# Patient Record
Sex: Female | Born: 1935
Health system: Southern US, Community
[De-identification: ages and names within clinical notes are randomized; demographics above are authoritative.]

## PROBLEM LIST (undated history)

## (undated) DIAGNOSIS — I10 Essential (primary) hypertension: Secondary | ICD-10-CM

## (undated) DIAGNOSIS — C20 Malignant neoplasm of rectum: Secondary | ICD-10-CM

## (undated) HISTORY — PX: CATARACT EXTRACTION, BILATERAL: SHX1313

## (undated) HISTORY — PX: COLONOSCOPY: SHX174

---

## 2001-06-23 ENCOUNTER — Encounter: Payer: Self-pay | Admitting: Family Medicine

## 2001-06-23 ENCOUNTER — Ambulatory Visit (HOSPITAL_COMMUNITY): Admission: RE | Admit: 2001-06-23 | Discharge: 2001-06-23 | Payer: Self-pay

## 2001-10-07 ENCOUNTER — Other Ambulatory Visit: Admission: RE | Admit: 2001-10-07 | Discharge: 2001-10-07 | Payer: Self-pay | Admitting: Family Medicine

## 2001-10-26 ENCOUNTER — Ambulatory Visit (HOSPITAL_COMMUNITY): Admission: RE | Admit: 2001-10-26 | Discharge: 2001-10-26 | Payer: Self-pay | Admitting: General Surgery

## 2002-06-26 ENCOUNTER — Encounter: Payer: Self-pay | Admitting: Family Medicine

## 2002-06-26 ENCOUNTER — Ambulatory Visit (HOSPITAL_COMMUNITY): Admission: RE | Admit: 2002-06-26 | Discharge: 2002-06-26 | Payer: Self-pay | Admitting: Family Medicine

## 2003-09-13 ENCOUNTER — Ambulatory Visit (HOSPITAL_COMMUNITY): Admission: RE | Admit: 2003-09-13 | Discharge: 2003-09-13 | Payer: Self-pay | Admitting: Family Medicine

## 2003-12-16 ENCOUNTER — Emergency Department (HOSPITAL_COMMUNITY): Admission: EM | Admit: 2003-12-16 | Discharge: 2003-12-16 | Payer: Self-pay | Admitting: Emergency Medicine

## 2003-12-22 ENCOUNTER — Emergency Department (HOSPITAL_COMMUNITY): Admission: EM | Admit: 2003-12-22 | Discharge: 2003-12-22 | Payer: Self-pay | Admitting: Emergency Medicine

## 2004-05-19 ENCOUNTER — Ambulatory Visit (HOSPITAL_COMMUNITY): Admission: RE | Admit: 2004-05-19 | Discharge: 2004-05-19 | Payer: Self-pay | Admitting: Family Medicine

## 2005-02-04 ENCOUNTER — Ambulatory Visit (HOSPITAL_COMMUNITY): Admission: RE | Admit: 2005-02-04 | Discharge: 2005-02-04 | Payer: Self-pay | Admitting: Family Medicine

## 2006-03-12 ENCOUNTER — Ambulatory Visit (HOSPITAL_COMMUNITY): Admission: RE | Admit: 2006-03-12 | Discharge: 2006-03-12 | Payer: Self-pay | Admitting: Family Medicine

## 2007-04-14 ENCOUNTER — Ambulatory Visit (HOSPITAL_COMMUNITY): Admission: RE | Admit: 2007-04-14 | Discharge: 2007-04-14 | Payer: Self-pay | Admitting: Family Medicine

## 2008-05-03 ENCOUNTER — Ambulatory Visit (HOSPITAL_COMMUNITY): Admission: RE | Admit: 2008-05-03 | Discharge: 2008-05-03 | Payer: Self-pay | Admitting: Family Medicine

## 2009-05-15 ENCOUNTER — Ambulatory Visit (HOSPITAL_COMMUNITY): Admission: RE | Admit: 2009-05-15 | Discharge: 2009-05-15 | Payer: Self-pay | Admitting: Family Medicine

## 2010-06-02 ENCOUNTER — Ambulatory Visit (HOSPITAL_COMMUNITY): Admission: RE | Admit: 2010-06-02 | Discharge: 2010-06-02 | Payer: Self-pay | Admitting: Family Medicine

## 2010-12-19 NOTE — H&P (Signed)
[facility]  Patient:    VALENA, IVANOV Visit Number: [id number] MRN: [id number]          Service Type: DSU Location: DAY Attending Physician:  Dessa Phi Dictated by:   Elpidio Anis, M.D. Admit Date:  10/26/2001 Discharge Date: 10/26/2001   CC:         John Giovanni, M.D.   History and Physical  CHIEF COMPLAINT:  This is a 77 year old female, with a history of progressive constipation, who is referred for screening colonoscopy.  HISTORY OF PRESENT ILLNESS:  There is no history of polyps or colon cancer. There is no family history of polyps or colon cancer.  The patient will have screening colonoscopy.  PAST MEDICAL HISTORY:  1. Hypertension.  2. Hypercholesterolemia.  MEDICATIONS:  1. Maxzide 50 mg q.d.  2. Lisinopril 20 mg q.d.  3. Zocor 40 mg q.d.  4. K-Dur 20 mEq q.d.  5. Evista 60 mg q.d.  6. Ecotrin 81 mg q.d.  7. Vitamin B with calcium two q.d.  PAST SURGICAL HISTORY:  None.  SOCIAL HISTORY:  No history of tobacco or alcohol or drug use.  REVIEW OF SYSTEMS:  Negative.  There is no history of heart or lung disease.  PHYSICAL EXAMINATION:  VITAL SIGNS:  Blood pressure 150/100, pulse 72, respirations 16.  Weight 162 pounds.  HEENT:  Unremarkable.  NECK:  Supple without JVD or bruits.  CHEST:  Clear to auscultation.  HEART:  Regular rate and rhythm without murmur, gallop, or rub.  ABDOMEN:  Soft, nontender.  No masses.  EXTREMITIES:  No clubbing, cyanosis, or edema.  NEUROLOGIC:  Nonfocal.  IMPRESSION:  1. Chronic constipation with recurrent rectal bleeding.  2. Hypertension.  PLAN:  Colonoscopy. Dictated by:   Elpidio Anis, M.D. Attending Physician:  Dessa Phi DD:  10/25/01 TD:  10/26/01 Job: [id number]

## 2011-05-19 ENCOUNTER — Other Ambulatory Visit (HOSPITAL_COMMUNITY): Payer: Self-pay | Admitting: Family Medicine

## 2011-05-19 DIAGNOSIS — Z139 Encounter for screening, unspecified: Secondary | ICD-10-CM

## 2011-06-05 ENCOUNTER — Ambulatory Visit (HOSPITAL_COMMUNITY)
Admission: RE | Admit: 2011-06-05 | Discharge: 2011-06-05 | Disposition: A | Discharge status: Home / Self Care | Payer: Medicare Other | Source: Ambulatory Visit | Attending: Family Medicine | Admitting: Family Medicine

## 2011-06-05 DIAGNOSIS — Z139 Encounter for screening, unspecified: Secondary | ICD-10-CM

## 2011-06-05 DIAGNOSIS — Z1231 Encounter for screening mammogram for malignant neoplasm of breast: Secondary | ICD-10-CM | POA: Insufficient documentation

## 2011-08-01 ENCOUNTER — Emergency Department (HOSPITAL_COMMUNITY)
Admission: EM | Admit: 2011-08-01 | Discharge: 2011-08-01 | Disposition: A | Discharge status: Home / Self Care | Payer: Medicare Other | Attending: Emergency Medicine | Admitting: Emergency Medicine

## 2011-08-01 ENCOUNTER — Other Ambulatory Visit: Payer: Self-pay

## 2011-08-01 ENCOUNTER — Emergency Department (HOSPITAL_COMMUNITY): Payer: Medicare Other

## 2011-08-01 DIAGNOSIS — Z7982 Long term (current) use of aspirin: Secondary | ICD-10-CM | POA: Insufficient documentation

## 2011-08-01 DIAGNOSIS — J9819 Other pulmonary collapse: Secondary | ICD-10-CM | POA: Insufficient documentation

## 2011-08-01 DIAGNOSIS — I10 Essential (primary) hypertension: Secondary | ICD-10-CM | POA: Insufficient documentation

## 2011-08-01 DIAGNOSIS — R42 Dizziness and giddiness: Secondary | ICD-10-CM | POA: Insufficient documentation

## 2011-08-01 HISTORY — DX: Essential (primary) hypertension: I10

## 2011-08-01 LAB — CBC
HCT: 45.4 % (ref 36.0–46.0)
MCH: 30.4 pg (ref 26.0–34.0)
MCHC: 32.8 g/dL (ref 30.0–36.0)
MCV: 92.7 fL (ref 78.0–100.0)
Platelets: 228 10*3/uL (ref 150–400)
RBC: 4.9 MIL/uL (ref 3.87–5.11)

## 2011-08-01 LAB — BASIC METABOLIC PANEL
BUN: 14 mg/dL (ref 6–23)
CO2: 27 mEq/L (ref 19–32)
GFR calc Af Amer: 69 mL/min — ABNORMAL LOW (ref >90)
Potassium: 3.5 mEq/L (ref 3.5–5.1)
Sodium: 138 mEq/L (ref 135–145)

## 2011-08-01 LAB — DIFFERENTIAL
Basophils Absolute: 0 10*3/uL (ref 0.0–0.1)
Basophils Relative: 0 % (ref 0–1)
Eosinophils Absolute: 0.1 10*3/uL (ref 0.0–0.7)
Eosinophils Relative: 1 % (ref 0–5)
Lymphocytes Relative: 30 % (ref 12–46)
Lymphs Abs: 1.9 10*3/uL (ref 0.7–4.0)
Neutro Abs: 3.7 10*3/uL (ref 1.7–7.7)
Neutrophils Relative %: 60 % (ref 43–77)

## 2011-08-01 LAB — URINALYSIS, ROUTINE W REFLEX MICROSCOPIC: pH: 7 (ref 5.0–8.0)

## 2011-08-01 NOTE — ED Provider Notes (Signed)
This chart was scribed for [employer]. Rubin Payor, MD by Wallis Mart. The patient was seen in room APA08/APA08 and the patient's care was started at 5:43 PM.   CSN: [id number]  Arrival date & time 08/01/11  1609   First MD Initiated Contact with Patient 08/01/11 1716      Chief Complaint  Patient presents with  . Dizziness    (Consider location/radiation/quality/duration/timing/severity/associated sxs/prior treatment) HPI Pt seen at 5:43 PM  Cheyenne Morris is a 77 y.o. female who presents to the Emergency Department complaining of sudden onset, moderate, intermittent dizziness that started when taking down Christmas decorations 3 hours ago. The dizziness worsens when moving around and describes the dizziness as a spinning sensation.   Per pt, it has been "a long time" since feeling dizzy and she hasn't seen anyone in the past for dizziness. Pt c/o associated nausea, facial pain, but is not currently feeling nauseated.Pt c/o weakness in legs that has improved. Pt denies headache, ringing in ears, and any other sx.    Pt w/ h/o of hypertension and dm.  Pt reports no recent changes in medications.  Pt c/o flu-like sx 2 weeks ago.     PCP: Dr Sudie Bailey  Past Medical History  Diagnosis Date  . Diabetes mellitus   . Hypertension     History reviewed. No pertinent past surgical history.  No family history on file.  History  Substance Use Topics  . Smoking status: Never Smoker   . Smokeless tobacco: Not on file  . Alcohol Use: No    OB History    Grav Para Term Preterm Abortions TAB SAB Ect Mult Living                  Review of Systems  10 Systems reviewed and are negative for acute change except as noted in the HPI.  Allergies  Review of patient's allergies indicates no known allergies.  Home Medications   Current Outpatient Rx  Name Route Sig Dispense Refill  . ASPIRIN 81 MG PO CHEW Oral Chew 81 mg by mouth daily.      Marland Kitchen CALCIUM-VITAMIN D 600-200 MG-UNIT PO  CAPS Oral Take 1 tablet by mouth daily.      Marland Kitchen GLIMEPIRIDE 2 MG PO TABS Oral Take 2 mg by mouth daily before breakfast.      . LATANOPROST 0.005 % OP SOLN Both Eyes Place 1 drop into both eyes at bedtime.      Marland Kitchen LISINOPRIL 20 MG PO TABS Oral Take 20 mg by mouth daily.      Marland Kitchen POTASSIUM CHLORIDE CRYS CR 20 MEQ PO TBCR Oral Take 20 mEq by mouth daily.      Marland Kitchen SIMVASTATIN 40 MG PO TABS Oral Take 40 mg by mouth at bedtime.      . TRIAMTERENE-HCTZ 75-50 MG PO TABS Oral Take 1 tablet by mouth daily.        BP 181/72  Pulse 79  Temp(Src) 98.5 F (36.9 C) (Oral)  Resp 18  Ht 5\' 2"  (1.575 m)  Wt 160 lb (72.576 kg)  BMI 29.26 kg/m2  SpO2 95%  Physical Exam  Nursing note and vitals reviewed. Constitutional: She is oriented to person, place, and time. She appears well-developed and well-nourished. No distress.  HENT:  Head: Normocephalic and atraumatic.  Mouth/Throat: Oropharynx is clear and moist.  Eyes: EOM are normal. Pupils are equal, round, and reactive to light.       No nystagmus  Neck: Normal range  of motion. Neck supple. No tracheal deviation present.  Cardiovascular: Normal rate, regular rhythm and normal heart sounds.   Pulmonary/Chest: Effort normal and breath sounds normal. No respiratory distress.  Abdominal: Soft. She exhibits no distension.  Musculoskeletal: Normal range of motion. She exhibits no edema.  Neurological: She is alert and oriented to person, place, and time. No sensory deficit.       Finger/nose test normal on left side Difficulty following commands for finger/nose test on right side   Skin: Skin is warm and dry.  Psychiatric: She has a normal mood and affect. Her behavior is normal.    ED Course  Procedures (including critical care time) DIAGNOSTIC STUDIES: Oxygen Saturation is 97% on room air, normal by my interpretation.    COORDINATION OF CARE:    Labs Reviewed  BASIC METABOLIC PANEL - Abnormal; Notable for the following:    Glucose, Bld 114 (*)      Calcium 11.5 (*)    GFR calc non Af Amer 60 (*)    GFR calc Af Amer 69 (*)    All other components within normal limits  URINALYSIS, ROUTINE W REFLEX MICROSCOPIC - Abnormal; Notable for the following:    Hgb urine dipstick TRACE (*)    Ketones, ur TRACE (*)    All other components within normal limits  CBC  DIFFERENTIAL  TROPONIN I  URINE MICROSCOPIC-ADD ON   Dg Chest 2 View  08/01/2011  *RADIOLOGY REPORT*  Clinical Data: Dizziness  CHEST - 2 VIEW  Comparison: None.  Findings: Cardiomediastinal silhouette is unremarkable.  No acute infiltrate or pleural effusion.  No pulmonary edema.  Bony thorax is unremarkable.  Mild basilar atelectasis or scarring.  IMPRESSION: No acute infiltrate or pulmonary edema.  Mild basilar atelectasis or scarring.  Original Report Authenticated By: Natasha Mead, M.D.   Ct Head Wo Contrast  08/01/2011  *RADIOLOGY REPORT*  Clinical Data: Dizziness and nausea.  Hypertension.  CT HEAD WITHOUT CONTRAST  Technique:  Contiguous axial images were obtained from the base of the skull through the vertex without contrast.  Comparison: None.  Findings: Scattered subcortical white matter hypoattenuation is evident bilaterally.  No acute cortical infarct, hemorrhage, or mass lesion is evident.  The ventricles are of normal size.  No significant extra-axial fluid collection is present.  The paranasal sinuses and mastoid air cells are clear. Atherosclerotic calcifications are noted within the cavernous carotid arteries bilaterally.  IMPRESSION:  1.  No acute intracranial abnormality. 2.  Mild white matter disease.  Original Report Authenticated By: Jamesetta Orleans. MATTERN, M.D.     1. Hypertension   2. Hypercalcemia   3. Dizziness     Date: 08/01/2011  Rate: 83  Rhythm: normal sinus rhythm  QRS Axis: normal  Intervals: normal  ST/T Wave abnormalities: normal  Conduction Disutrbances:none  Narrative Interpretation:   Old EKG Reviewed: none available     MDM  Patient  presents with dizziness for couple hours. She's recently on antibiotics for sinus infection. She was found to have an elevated blood pressure. She states she's not been taking her water pill because it makes her urinate a lot. She was also found to have a mild hypercalcemia. She is on calcium supplementation. Her blood pressures come down somewhat on its own. EKG does show ischemic changes. Her head CT is negative for bleed. I doubt this is a central vertigo. She will followup with Dr. Sudie Bailey, her primary care Dr. She will hold off on her calcium supplementation for 3 days.  I personally performed the services described in this documentation, which was scribed in my presence. The recorded information has been reviewed and considered.      Juliet Rude. Rubin Payor, MD 08/01/11 2034

## 2011-08-01 NOTE — ED Notes (Signed)
Pt presents with dizziness x 2 hours. Pt denies all other symptoms. Pt states she was recently seen by PMD for sinus infection and has completed ABX.

## 2011-08-01 NOTE — ED Notes (Signed)
Pt assisted to restroom, pt c/o minimal dizziness.

## 2011-08-04 ENCOUNTER — Emergency Department (HOSPITAL_COMMUNITY)
Admission: EM | Admit: 2011-08-04 | Discharge: 2011-08-04 | Disposition: A | Discharge status: Home / Self Care | Payer: Medicare Other | Attending: Emergency Medicine | Admitting: Emergency Medicine

## 2011-08-04 ENCOUNTER — Encounter (HOSPITAL_COMMUNITY): Payer: Self-pay | Admitting: Emergency Medicine

## 2011-08-04 DIAGNOSIS — Z79899 Other long term (current) drug therapy: Secondary | ICD-10-CM | POA: Insufficient documentation

## 2011-08-04 DIAGNOSIS — I10 Essential (primary) hypertension: Secondary | ICD-10-CM | POA: Insufficient documentation

## 2011-08-04 DIAGNOSIS — J329 Chronic sinusitis, unspecified: Secondary | ICD-10-CM

## 2011-08-04 DIAGNOSIS — R51 Headache: Secondary | ICD-10-CM | POA: Insufficient documentation

## 2011-08-04 DIAGNOSIS — Z7982 Long term (current) use of aspirin: Secondary | ICD-10-CM | POA: Insufficient documentation

## 2011-08-04 DIAGNOSIS — E119 Type 2 diabetes mellitus without complications: Secondary | ICD-10-CM | POA: Insufficient documentation

## 2011-08-04 DIAGNOSIS — J3489 Other specified disorders of nose and nasal sinuses: Secondary | ICD-10-CM | POA: Insufficient documentation

## 2011-08-04 MED ORDER — KETOROLAC TROMETHAMINE 60 MG/2ML IM SOLN
60.0000 mg | Freq: Once | INTRAMUSCULAR | Status: AC
  Administered 2011-08-04: 60 mg via INTRAMUSCULAR
  Filled 2011-08-04: qty 2

## 2011-08-04 MED ORDER — DOXYCYCLINE HYCLATE 100 MG PO CAPS: 100.0000 mg | ORAL_CAPSULE | Freq: Two times a day (BID) | ORAL | Status: AC

## 2011-08-04 NOTE — ED Notes (Signed)
Patient reports stuffy nose and congestion in head, nose. States the congestion and stuffy nose started bothering her today. Was seen here Saturday for elevated blood pressure. Denies fever, chills.

## 2011-08-04 NOTE — ED Provider Notes (Signed)
History     CSN: [id number]  Arrival date & time 08/04/11  0129   First MD Initiated Contact with Patient 08/04/11 0240      Chief Complaint  Patient presents with  . Nasal Congestion    (Consider location/radiation/quality/duration/timing/severity/associated sxs/prior treatment) HPI Comments: 78 year old female with history of 24 hours of sinus pressure, pain with associated bitemporal headache. She has had nasal drainage which has been purulent but denies fevers nausea vomiting cough or shortness of breath. Symptoms are persistent, mild, 3/10 in intensity and not associated with stiff neck, fever, weakness numbness ataxia blurred vision or difficulty with speech. This was gradual in onset  The history is provided by the patient, the spouse and medical records.    Past Medical History  Diagnosis Date  . Diabetes mellitus   . Hypertension     History reviewed. No pertinent past surgical history.  Family History  Problem Relation Age of Onset  . Diabetes Son     History  Substance Use Topics  . Smoking status: Never Smoker   . Smokeless tobacco: Not on file  . Alcohol Use: No    OB History    Grav Para Term Preterm Abortions TAB SAB Ect Mult Living                  Review of Systems  All other systems reviewed and are negative.    Allergies  Review of patient's allergies indicates no known allergies.  Home Medications   Current Outpatient Rx  Name Route Sig Dispense Refill  . ASPIRIN 81 MG PO CHEW Oral Chew 81 mg by mouth daily.      Marland Kitchen CALCIUM-VITAMIN D 600-200 MG-UNIT PO CAPS Oral Take 1 tablet by mouth daily.      Marland Kitchen DOXYCYCLINE HYCLATE 100 MG PO CAPS Oral Take 1 capsule (100 mg total) by mouth 2 (two) times daily. 20 capsule 0  . GLIMEPIRIDE 2 MG PO TABS Oral Take 2 mg by mouth daily before breakfast.      . LATANOPROST 0.005 % OP SOLN Both Eyes Place 1 drop into both eyes at bedtime.      Marland Kitchen LISINOPRIL 20 MG PO TABS Oral Take 20 mg by mouth daily.        Marland Kitchen POTASSIUM CHLORIDE CRYS ER 20 MEQ PO TBCR Oral Take 20 mEq by mouth daily.      Marland Kitchen SIMVASTATIN 40 MG PO TABS Oral Take 40 mg by mouth at bedtime.      . TRIAMTERENE-HCTZ 75-50 MG PO TABS Oral Take 1 tablet by mouth daily.        BP 149/81  Pulse 91  Temp(Src) 98.8 F (37.1 C) (Oral)  Resp 18  Ht 5\' 2"  (1.575 m)  Wt 160 lb (72.576 kg)  BMI 29.26 kg/m2  SpO2 97%  Physical Exam  Nursing note and vitals reviewed. Constitutional: She appears well-developed and well-nourished. No distress.  HENT:  Head: Normocephalic and atraumatic.  Mouth/Throat: Oropharynx is clear and moist. No oropharyngeal exudate.       Nasal congestion bilaterally, turbinate swelling bilaterally. Nasal discharge present, oropharynx clear, tympanic membranes clear bilaterally  Eyes: Conjunctivae and EOM are normal. Pupils are equal, round, and reactive to light. Right eye exhibits no discharge. Left eye exhibits no discharge. No scleral icterus.  Neck: Normal range of motion. Neck supple. No JVD present. No thyromegaly present.  Cardiovascular: Normal rate, regular rhythm, normal heart sounds and intact distal pulses.  Exam reveals no gallop and no  friction rub.   No murmur heard. Pulmonary/Chest: Effort normal and breath sounds normal. No respiratory distress. She has no wheezes. She has no rales.  Abdominal: Soft. Bowel sounds are normal. She exhibits no distension and no mass. There is no tenderness.  Musculoskeletal: Normal range of motion. She exhibits no edema and no tenderness.  Lymphadenopathy:    She has no cervical adenopathy.  Neurological: She is alert. Coordination normal.  Skin: Skin is warm and dry. No rash noted. No erythema.  Psychiatric: She has a normal mood and affect. Her behavior is normal.    ED Course  Procedures (including critical care time)  Labs Reviewed - No data to display No results found.   1. Sinus headache   2. Sinusitis       MDM  Well-appearing female with  reassuring vital signs, amenable to treatment with Zyrtec, doxycycline, intramuscular Toradol prior to discharge.        Vida Roller, MD 08/04/11 [phone]

## 2011-08-07 ENCOUNTER — Encounter (HOSPITAL_COMMUNITY): Payer: Self-pay | Admitting: *Deleted

## 2011-08-07 ENCOUNTER — Emergency Department (HOSPITAL_COMMUNITY)
Admission: EM | Admit: 2011-08-07 | Discharge: 2011-08-07 | Disposition: A | Discharge status: Home / Self Care | Payer: Medicare Other | Attending: Emergency Medicine | Admitting: Emergency Medicine

## 2011-08-07 DIAGNOSIS — Z79899 Other long term (current) drug therapy: Secondary | ICD-10-CM | POA: Insufficient documentation

## 2011-08-07 DIAGNOSIS — E119 Type 2 diabetes mellitus without complications: Secondary | ICD-10-CM | POA: Insufficient documentation

## 2011-08-07 DIAGNOSIS — Z7982 Long term (current) use of aspirin: Secondary | ICD-10-CM | POA: Insufficient documentation

## 2011-08-07 DIAGNOSIS — R42 Dizziness and giddiness: Secondary | ICD-10-CM | POA: Insufficient documentation

## 2011-08-07 DIAGNOSIS — I10 Essential (primary) hypertension: Secondary | ICD-10-CM | POA: Insufficient documentation

## 2011-08-07 DIAGNOSIS — R51 Headache: Secondary | ICD-10-CM | POA: Insufficient documentation

## 2011-08-07 LAB — DIFFERENTIAL
Basophils Relative: 0 % (ref 0–1)
Eosinophils Relative: 2 % (ref 0–5)
Lymphocytes Relative: 46 % (ref 12–46)
Lymphs Abs: 2.4 10*3/uL (ref 0.7–4.0)
Monocytes Absolute: 0.5 10*3/uL (ref 0.1–1.0)
Monocytes Relative: 9 % (ref 3–12)
Neutrophils Relative %: 43 % (ref 43–77)

## 2011-08-07 LAB — BASIC METABOLIC PANEL
Calcium: 11.5 mg/dL — ABNORMAL HIGH (ref 8.4–10.5)
Creatinine, Ser: 1.34 mg/dL — ABNORMAL HIGH (ref 0.50–1.10)
GFR calc Af Amer: 43 mL/min — ABNORMAL LOW (ref >90)
GFR calc non Af Amer: 37 mL/min — ABNORMAL LOW (ref >90)
Sodium: 137 mEq/L (ref 135–145)

## 2011-08-07 LAB — CBC
HCT: 42.5 % (ref 36.0–46.0)
MCH: 29.6 pg (ref 26.0–34.0)
MCHC: 31.8 g/dL (ref 30.0–36.0)
MCV: 93.2 fL (ref 78.0–100.0)
Platelets: 194 10*3/uL (ref 150–400)
WBC: 5.1 10*3/uL (ref 4.0–10.5)

## 2011-08-07 MED ORDER — AMLODIPINE BESYLATE 5 MG PO TABS
5.0000 mg | ORAL_TABLET | Freq: Once | ORAL | Status: AC
  Administered 2011-08-07: 5 mg via ORAL
  Filled 2011-08-07: qty 1

## 2011-08-07 MED ORDER — AMLODIPINE BESYLATE 10 MG PO TABS: 5.0000 mg | ORAL_TABLET | Freq: Every day | ORAL | Status: DC

## 2011-08-07 NOTE — ED Notes (Signed)
Pt states, "I think my blood pressure is high" Symptoms include headache ("nagging pain") and dizziness. NAD.

## 2011-08-07 NOTE — ED Notes (Signed)
Pt states this is the third time she has been here for elevated bp states she recently started a otc cold med

## 2011-08-07 NOTE — ED Provider Notes (Signed)
History     CSN: [id number]  Arrival date & time 08/07/11  1811   First MD Initiated Contact with Patient 08/07/11 1826      Chief Complaint  Patient presents with  . Hypertension    (Consider location/radiation/quality/duration/timing/severity/associated sxs/prior treatment) HPI Patient presents to the emergency room with complaints of high blood pressure. Patient states she's had a very mild headache in the frontal region of her head and she felt a little bit lightheaded. Patient states she's had this from before when her blood pressure was higher. She has not had any trouble with her gait. She has not had numbness or weakness. She's not had any trouble with her speech and there's been no confusion. Patient denies any problems with chest pain or shortness of breath. Patient does have history of hypertension and she takes blood pressure medications including lisinopril and Maxzide. She has not skipped any of those and last took her medications this morning.  Patient states the symptoms are mild to moderate. Past Medical History  Diagnosis Date  . Diabetes mellitus   . Hypertension     History reviewed. No pertinent past surgical history.  Family History  Problem Relation Age of Onset  . Diabetes Son     History  Substance Use Topics  . Smoking status: Never Smoker   . Smokeless tobacco: Not on file  . Alcohol Use: No    OB History    Grav Para Term Preterm Abortions TAB SAB Ect Mult Living                  Review of Systems  All other systems reviewed and are negative.    Allergies  Review of patient's allergies indicates no known allergies.  Home Medications   Current Outpatient Rx  Name Route Sig Dispense Refill  . ASPIRIN 81 MG PO CHEW Oral Chew 81 mg by mouth daily.      Marland Kitchen CALCIUM-VITAMIN D 600-200 MG-UNIT PO CAPS Oral Take 1 tablet by mouth daily.      Marland Kitchen DOXYCYCLINE HYCLATE 100 MG PO CAPS Oral Take 1 capsule (100 mg total) by mouth 2 (two) times daily. 20  capsule 0  . GLIMEPIRIDE 2 MG PO TABS Oral Take 2 mg by mouth daily before breakfast.      . LATANOPROST 0.005 % OP SOLN Both Eyes Place 1 drop into both eyes at bedtime.      Marland Kitchen LISINOPRIL 20 MG PO TABS Oral Take 20 mg by mouth daily.      Marland Kitchen POTASSIUM CHLORIDE CRYS ER 20 MEQ PO TBCR Oral Take 20 mEq by mouth daily.      Marland Kitchen SIMVASTATIN 40 MG PO TABS Oral Take 40 mg by mouth at bedtime.      . TRIAMTERENE-HCTZ 75-50 MG PO TABS Oral Take 1 tablet by mouth daily.        BP 206/82  Pulse 100  Temp(Src) 98.3 F (36.8 C) (Oral)  Resp 18  SpO2 100%  Physical Exam  Nursing note and vitals reviewed. Constitutional: She is oriented to person, place, and time. She appears well-developed and well-nourished. No distress.  HENT:  Head: Normocephalic and atraumatic.  Right Ear: External ear normal.  Left Ear: External ear normal.  Mouth/Throat: Oropharynx is clear and moist.  Eyes: Conjunctivae are normal. Right eye exhibits no discharge. Left eye exhibits no discharge. No scleral icterus.  Neck: Neck supple. No tracheal deviation present.  Cardiovascular: Normal rate, regular rhythm and intact distal pulses.  Pulmonary/Chest: Effort normal and breath sounds normal. No stridor. No respiratory distress. She has no wheezes. She has no rales.  Abdominal: Soft. Bowel sounds are normal. She exhibits no distension. There is no tenderness. There is no rebound and no guarding.  Musculoskeletal: She exhibits no edema and no tenderness.  Neurological: She is alert and oriented to person, place, and time. She has normal strength. No cranial nerve deficit ( no gross defecits noted) or sensory deficit. She exhibits normal muscle tone. She displays no seizure activity. Coordination normal.       5 out of 5 strength bilateral upper [place] some lower extremities, sensation intact in all extremities, no visual field cuts, no left or right sided neglect, patient was able to ambulate to the room without difficulty    Skin: Skin is warm and dry. No rash noted.  Psychiatric: She has a normal mood and affect.    ED Course  Procedures (including critical care time)  Labs Reviewed  BASIC METABOLIC PANEL - Abnormal; Notable for the following:    Glucose, Bld 115 (*)    BUN 34 (*)    Creatinine, Ser 1.34 (*)    Calcium 11.5 (*)    GFR calc non Af Amer 37 (*)    GFR calc Af Amer 43 (*)    All other components within normal limits  CBC  DIFFERENTIAL   No results found.  Diagnosis: Hypertension   MDM  The patient's blood pressure is improved with the Norvasc. Patient has a normal neurologic exam and has been able to ambulate. I doubt stroke or TIA. Her BUN and creatinine are slightly increased from previous values. For that reason I will not increase her Zestril. Patient has responded to the Norvasc. Will start her on a low dose of that and have her followup with her primary-care Dr. next week.        Celene Kras, MD 08/07/11 [phone]

## 2011-12-02 ENCOUNTER — Other Ambulatory Visit (HOSPITAL_COMMUNITY): Payer: Self-pay | Admitting: Family Medicine

## 2011-12-09 ENCOUNTER — Ambulatory Visit (HOSPITAL_COMMUNITY)
Admission: RE | Admit: 2011-12-09 | Discharge: 2011-12-09 | Disposition: A | Discharge status: Home / Self Care | Payer: Medicare Other | Source: Ambulatory Visit | Attending: Family Medicine | Admitting: Family Medicine

## 2011-12-09 DIAGNOSIS — M810 Age-related osteoporosis without current pathological fracture: Secondary | ICD-10-CM | POA: Insufficient documentation

## 2011-12-29 NOTE — H&P (Signed)
  NTS SOAP Note  Vital Signs:  Vitals as of: 12/29/2011: Systolic 177: Diastolic 89: Heart Rate 91: Temp 98.30F: Height 11ft 2in: Weight 157Lbs 0 Ounces: BMI 29  BMI : 28.72 kg/m2  Subjective: This 34 Years 94 Months old Female presents for screening TCS.  Had a TCS many years ago.  Denies any GI complaints.  No family h/o colon carcinoma.   Review of Symptoms:  Constitutional:unremarkable   Head:unremarkable    Eyes:unremarkable   Nose/Mouth/Throat:unremarkable Cardiovascular:  unremarkable   Respiratory:unremarkable   Gastrointestinal:  unremarkable   Genitourinary:unremarkable     Musculoskeletal:unremarkable   Skin:unremarkable Hematolgic/Lymphatic:unremarkable     Allergic/Immunologic:unremarkable     Past Medical History:    Reviewed   Past Medical History  Surgical History: unknown Medical Problems:  Diabetes, High Blood pressure, High cholesterol Allergies: nkda Medications: lisinopril, baby ASA, maxide, kdur, simvastatin, calcium, glipuride    Social History:Reviewed  Social History  Preferred Language: English ([place]) Race:  Black or African American Ethnicity: Not Hispanic / Latino Age: 78 Years 5 Months Marital Status:  M Alcohol:  No Recreational drug(s):  No   Smoking Status: Never smoker reviewed on 12/29/2011  Family History:  Reviewed   Family History  Is there a family history of:No family h/o colon carcinoma    Objective Information: General:  Well appearing, well nourished in no distress. Head:Atraumatic; no masses; no abnormalities Heart:  RRR, no murmur Lungs:    CTA bilaterally, no wheezes, rhonchi, rales.  Breathing unlabored. Abdomen:Soft, NT/ND, no HSM, no masses.   deferred to procedure  Assessment:Need for screening TCS  Diagnosis &amp; Procedure: DiagnosisCode: V76.51, ProcedureCode: 16109,    Plan:Scheduled for TCS on 01/05/12.   Patient  Education:Alternative treatments to surgery were discussed with patient (and family).  Risks and benefits  of procedure were fully explained to the patient (and family) who gave informed consent. Patient/family questions were addressed.  Follow-up:Pending Surgery

## 2011-12-31 ENCOUNTER — Encounter (HOSPITAL_COMMUNITY): Payer: Self-pay | Admitting: Pharmacy Technician

## 2012-01-04 MED ORDER — SODIUM CHLORIDE 0.45 % IV SOLN
Freq: Once | INTRAVENOUS | Status: AC
  Administered 2012-01-05: 09:00:00 via INTRAVENOUS

## 2012-01-05 ENCOUNTER — Encounter (HOSPITAL_COMMUNITY): Payer: Self-pay | Admitting: *Deleted

## 2012-01-05 ENCOUNTER — Encounter (HOSPITAL_COMMUNITY)
Admission: RE | Disposition: A | Discharge status: Home / Self Care | Payer: Self-pay | Source: Ambulatory Visit | Attending: General Surgery

## 2012-01-05 ENCOUNTER — Ambulatory Visit (HOSPITAL_COMMUNITY)
Admission: RE | Admit: 2012-01-05 | Discharge: 2012-01-05 | Disposition: A | Discharge status: Home / Self Care | Payer: Medicare Other | Source: Ambulatory Visit | Attending: General Surgery | Admitting: General Surgery

## 2012-01-05 DIAGNOSIS — Z1211 Encounter for screening for malignant neoplasm of colon: Secondary | ICD-10-CM | POA: Insufficient documentation

## 2012-01-05 DIAGNOSIS — Z01812 Encounter for preprocedural laboratory examination: Secondary | ICD-10-CM | POA: Insufficient documentation

## 2012-01-05 DIAGNOSIS — E119 Type 2 diabetes mellitus without complications: Secondary | ICD-10-CM | POA: Insufficient documentation

## 2012-01-05 DIAGNOSIS — D126 Benign neoplasm of colon, unspecified: Secondary | ICD-10-CM | POA: Insufficient documentation

## 2012-01-05 DIAGNOSIS — I10 Essential (primary) hypertension: Secondary | ICD-10-CM | POA: Insufficient documentation

## 2012-01-05 DIAGNOSIS — E7800 Pure hypercholesterolemia, unspecified: Secondary | ICD-10-CM | POA: Insufficient documentation

## 2012-01-05 DIAGNOSIS — Z79899 Other long term (current) drug therapy: Secondary | ICD-10-CM | POA: Insufficient documentation

## 2012-01-05 HISTORY — PX: COLONOSCOPY: SHX5424

## 2012-01-05 LAB — GLUCOSE, CAPILLARY: Glucose-Capillary: 116 mg/dL — ABNORMAL HIGH (ref 70–99)

## 2012-01-05 SURGERY — COLONOSCOPY
Anesthesia: Moderate Sedation

## 2012-01-05 MED ORDER — STERILE WATER FOR IRRIGATION IR SOLN
Status: DC | PRN
  Administered 2012-01-05: 10:00:00

## 2012-01-05 MED ORDER — MIDAZOLAM HCL 5 MG/5ML IJ SOLN
INTRAMUSCULAR | Status: DC | PRN
  Administered 2012-01-05: 2 mg via INTRAVENOUS
  Administered 2012-01-05: 1 mg via INTRAVENOUS

## 2012-01-05 MED ORDER — MEPERIDINE HCL 50 MG/ML IJ SOLN
INTRAMUSCULAR | Status: AC
  Filled 2012-01-05: qty 2

## 2012-01-05 MED ORDER — MIDAZOLAM HCL 5 MG/5ML IJ SOLN
INTRAMUSCULAR | Status: AC
  Filled 2012-01-05: qty 10

## 2012-01-05 MED ORDER — MEPERIDINE HCL 25 MG/ML IJ SOLN
INTRAMUSCULAR | Status: DC | PRN
  Administered 2012-01-05: 50 mg via INTRAVENOUS

## 2012-01-05 NOTE — Interval H&P Note (Signed)
History and Physical Interval Note:  01/05/2012 9:45 AM  Cheyenne Morris  has presented today for surgery, with the diagnosis of Special screening for malignant neoplasms, colon   The various methods of treatment have been discussed with the patient and family. After consideration of risks, benefits and other options for treatment, the patient has consented to  Procedure(s) (LRB): COLONOSCOPY (N/A) as a surgical intervention .  The patients' history has been reviewed, patient examined, no change in status, stable for surgery.  I have reviewed the patients' chart and labs.  Questions were answered to the patient's satisfaction.     Franky Macho A

## 2012-01-05 NOTE — Op Note (Signed)
[facility] [address] [place], [place]  [address]  COLONOSCOPY PROCEDURE REPORT  PATIENT:  Cheyenne, Morris  MR#:  [id number] BIRTHDATE:  1931/06/09, 80 yrs. old  GENDER:  female ENDOSCOPIST:  Franky Macho, MD REF. BY:  Gareth Morgan, M.D. PROCEDURE DATE:  01/05/2012 PROCEDURE:  Colonoscopy with snare polypectomy ASA CLASS:  Class II INDICATIONS:  Screening MEDICATIONS:   Versed 3 mg IV, demerol 50 mg IV  DESCRIPTION OF PROCEDURE:   After the risks benefits and alternatives of the procedure were thoroughly explained, informed consent was obtained.  Digital rectal exam was performed and revealed external hemorrhoids.   The [id number] ([id number]) endoscope was introduced through the anus and advanced to the cecum, which was identified by both the appendix and ileocecal valve, without limitations.  The quality of the prep was adequate..  The instrument was then slowly withdrawn as the colon was fully examined. <<PROCEDUREIMAGES>> FINDINGS:  A sessile polyp was found in the ascending colon (see image001).   Very small, removed with snare cautery, sent to pathology for further examination.  Retroflexed views in the rectum revealed no abnormalities.   The scope was then withdrawn fr om the cecum and the procedure completed. COMPLICATIONS:  None ENDOSCOPIC IMPRESSION: 1) Sessile polyp in the ascending colon 2) Normal colonoscopy otherwise RECOMMENDATIONS: 1) Await pathology results REPEAT EXAM:  In 5 year(s) for Colonoscopy.  ______________________________ Franky Macho, MD  CC:  Gareth Morgan MD  n. Rosalie DoctorFranky Macho at 01/05/2012 10:17 AM  Helyn Numbers, [id number]

## 2012-01-05 NOTE — Discharge Instructions (Signed)
Colonoscopy Care After Read the instructions outlined below and refer to this sheet in the next few weeks. These discharge instructions provide you with general information on caring for yourself after you leave the hospital. Your doctor may also give you specific instructions. While your treatment has been planned according to the most current medical practices available, unavoidable complications occasionally occur. If you have any problems or questions after discharge, call your doctor. HOME CARE INSTRUCTIONS ACTIVITY:  You may resume your regular activity, but move at a slower pace for the next 24 hours.   Take frequent rest periods for the next 24 hours.   Walking will help get rid of the air and reduce the bloated feeling in your belly (abdomen).   No driving for 24 hours (because of the medicine (anesthesia) used during the test).   You may shower.   Do not sign any important legal documents or operate any machinery for 24 hours (because of the anesthesia used during the test).  NUTRITION:  Drink plenty of fluids.   You may resume your normal diet as instructed by your doctor.   Begin with a light meal and progress to your normal diet. Heavy or fried foods are harder to digest and may make you feel sick to your stomach (nauseated).   Avoid alcoholic beverages for 24 hours or as instructed.  MEDICATIONS:  You may resume your normal medications unless your doctor tells you otherwise.  WHAT TO EXPECT TODAY:  Some feelings of bloating in the abdomen.   Passage of more gas than usual.   Spotting of blood in your stool or on the toilet paper.  IF YOU HAD POLYPS REMOVED DURING THE COLONOSCOPY:  No aspirin products for 7 days or as instructed.   No alcohol for 7 days or as instructed.   Eat a soft diet for the next 24 hours.  FINDING OUT THE RESULTS OF YOUR TEST Not all test results are available during your visit. If your test results are not back during the visit, make an  appointment with your caregiver to find out the results. Do not assume everything is normal if you have not heard from your caregiver or the medical facility. It is important for you to follow up on all of your test results.  SEEK IMMEDIATE MEDICAL CARE IF:  You have more than a spotting of blood in your stool.   Your belly is swollen (abdominal distention).   You are nauseated or vomiting.   You have a fever.   You have abdominal pain or discomfort that is severe or gets worse throughout the day.  Document Released: 03/03/2004 Document Revised: 07/09/2011 Document Reviewed: 03/01/2008 [facility] Patient Information 2012 [place], [place].Colon Polyps A polyp is extra tissue that grows inside your body. Colon polyps grow in the large intestine. The large intestine, also called the colon, is part of your digestive system. It is a long, hollow tube at the end of your digestive tract where your body makes and stores stool. Most polyps are not dangerous. They are benign. This means they are not cancerous. But over time, some types of polyps can turn into cancer. Polyps that are smaller than a pea are usually not harmful. But larger polyps could someday become or may already be cancerous. To be safe, doctors remove all polyps and test them.  WHO GETS POLYPS? Anyone can get polyps, but certain people are more likely than others. You may have a greater chance of getting polyps if:  You  are over 50.   You have had polyps before.   Someone in your family has had polyps.   Someone in your family has had cancer of the large intestine.   Find out if someone in your family has had polyps. You may also be more likely to get polyps if you:   Eat a lot of fatty foods.   Smoke.   Drink alcohol.   Do not exercise.   Eat too much.  SYMPTOMS  Most small polyps do not cause symptoms. People often do not know they have one until their caregiver finds it during a regular checkup or while testing them for  something else. Some people do have symptoms like these:  Bleeding from the anus. You might notice blood on your underwear or on toilet paper after you have had a bowel movement.   Constipation or diarrhea that lasts more than a week.   Blood in the stool. Blood can make stool look black or it can show up as red streaks in the stool.  If you have any of these symptoms, see your caregiver. HOW DOES THE DOCTOR TEST FOR POLYPS? The doctor can use four tests to check for polyps:  Digital rectal exam. The caregiver wears gloves and checks your rectum (the last part of the large intestine) to see if it feels normal. This test would find polyps only in the rectum. Your caregiver may need to do one of the other tests listed below to find polyps higher up in the intestine.   Barium enema. The caregiver puts a liquid called barium into your rectum before taking x-rays of your large intestine. Barium makes your intestine look white in the pictures. Polyps are dark, so they are easy to see.   Sigmoidoscopy. With this test, the caregiver can see inside your large intestine. A thin flexible tube is placed into your rectum. The device is called a sigmoidoscope, which has a light and a tiny video camera in it. The caregiver uses the sigmoidoscope to look at the last third of your large intestine.   Colonoscopy. This test is like sigmoidoscopy, but the caregiver looks at all of the large intestine. It usually requires sedation. This is the most common method for finding and removing polyps.  TREATMENT   The caregiver will remove the polyp during sigmoidoscopy or colonoscopy. The polyp is then tested for cancer.   If you have had polyps, your caregiver may want you to get tested regularly in the future.  PREVENTION  There is not one sure way to prevent polyps. You might be able to lower your risk of getting them if you:  Eat more fruits and vegetables and less fatty food.   Do not smoke.   Avoid  alcohol.   Exercise every day.   Lose weight if you are overweight.   Eating more calcium and folate can also lower your risk of getting polyps. Some foods that are rich in calcium are milk, cheese, and broccoli. Some foods that are rich in folate are chickpeas, kidney beans, and spinach.   Aspirin might help prevent polyps. Studies are under way.  Document Released: 04/15/2004 Document Revised: 07/09/2011 Document Reviewed: 09/21/2007 [facility] Patient Information 2012 [place], [place].

## 2012-01-07 ENCOUNTER — Encounter (HOSPITAL_COMMUNITY): Payer: Self-pay | Admitting: General Surgery

## 2012-05-09 ENCOUNTER — Other Ambulatory Visit (HOSPITAL_COMMUNITY): Payer: Self-pay | Admitting: Family Medicine

## 2012-05-09 DIAGNOSIS — IMO0001 Reserved for inherently not codable concepts without codable children: Secondary | ICD-10-CM

## 2012-06-06 ENCOUNTER — Ambulatory Visit (HOSPITAL_COMMUNITY)
Admission: RE | Admit: 2012-06-06 | Discharge: 2012-06-06 | Disposition: A | Discharge status: Home / Self Care | Payer: Medicare Other | Source: Ambulatory Visit | Attending: Family Medicine | Admitting: Family Medicine

## 2012-06-06 DIAGNOSIS — IMO0001 Reserved for inherently not codable concepts without codable children: Secondary | ICD-10-CM

## 2012-06-06 DIAGNOSIS — Z1231 Encounter for screening mammogram for malignant neoplasm of breast: Secondary | ICD-10-CM | POA: Insufficient documentation

## 2013-05-16 ENCOUNTER — Other Ambulatory Visit (HOSPITAL_COMMUNITY): Payer: Self-pay | Admitting: Family Medicine

## 2013-05-16 DIAGNOSIS — Z139 Encounter for screening, unspecified: Secondary | ICD-10-CM

## 2013-06-08 ENCOUNTER — Ambulatory Visit (HOSPITAL_COMMUNITY)
Admission: RE | Admit: 2013-06-08 | Discharge: 2013-06-08 | Disposition: A | Discharge status: Home / Self Care | Payer: Medicare Other | Source: Ambulatory Visit | Attending: Family Medicine | Admitting: Family Medicine

## 2013-06-08 DIAGNOSIS — Z1231 Encounter for screening mammogram for malignant neoplasm of breast: Secondary | ICD-10-CM | POA: Insufficient documentation

## 2013-06-08 DIAGNOSIS — Z139 Encounter for screening, unspecified: Secondary | ICD-10-CM

## 2013-11-23 ENCOUNTER — Encounter: Payer: Self-pay | Admitting: *Deleted

## 2013-12-26 ENCOUNTER — Ambulatory Visit: Payer: Medicare Other | Admitting: Gastroenterology

## 2014-01-23 ENCOUNTER — Encounter: Payer: Self-pay | Admitting: Gastroenterology

## 2014-01-23 ENCOUNTER — Ambulatory Visit (INDEPENDENT_AMBULATORY_CARE_PROVIDER_SITE_OTHER): Payer: Medicare Other | Admitting: Gastroenterology

## 2014-01-23 ENCOUNTER — Encounter (INDEPENDENT_AMBULATORY_CARE_PROVIDER_SITE_OTHER): Payer: Self-pay

## 2014-01-23 VITALS — BP 147/87 | HR 91 | Temp 99.2°F | Resp 18 | Ht 62.0 in | Wt 152.0 lb

## 2014-01-23 DIAGNOSIS — K5900 Constipation, unspecified: Secondary | ICD-10-CM

## 2014-01-23 MED ORDER — HYDROCORTISONE 2.5 % RE CREA: 1.0000 "application " | TOPICAL_CREAM | Freq: Two times a day (BID) | RECTAL | Status: DC

## 2014-01-23 MED ORDER — LINACLOTIDE 145 MCG PO CAPS: 145.0000 ug | ORAL_CAPSULE | Freq: Every day | ORAL | Status: DC

## 2014-01-23 NOTE — Patient Instructions (Addendum)
For constipation: start taking Linzess 1 capsule each morning at least 30 minutes before breakfast. You may have loose stool for a few days while getting used to the medicine; if it continues, call our office. Please let me know if we need to [facility] if you like the medication.  You may use the Anusol cream twice a day for 7 days.   We will see you back in 3 months! Please call if no improvement or worsening of your symptoms.   Hemorrhoids Hemorrhoids are swollen veins around the rectum or anus. There are two types of hemorrhoids:   Internal hemorrhoids. These occur in the veins just inside the rectum. They may poke through to the outside and become irritated and painful.  External hemorrhoids. These occur in the veins outside the anus and can be felt as a painful swelling or hard lump near the anus. CAUSES  Pregnancy.   Obesity.   Constipation or diarrhea.   Straining to have a bowel movement.   Sitting for long periods on the toilet.  Heavy lifting or other activity that caused you to strain.  Anal intercourse. SYMPTOMS   Pain.   Anal itching or irritation.   Rectal bleeding.   Fecal leakage.   Anal swelling.   One or more lumps around the anus.  DIAGNOSIS  Your caregiver may be able to diagnose hemorrhoids by visual examination. Other examinations or tests that may be performed include:   Examination of the rectal area with a gloved hand (digital rectal exam).   Examination of anal canal using a small tube (scope).   A blood test if you have lost a significant amount of blood.  A test to look inside the colon (sigmoidoscopy or colonoscopy). TREATMENT Most hemorrhoids can be treated at home. However, if symptoms do not seem to be getting better or if you have a lot of rectal bleeding, your caregiver may perform a procedure to help make the hemorrhoids get smaller or remove them completely. Possible treatments include:    Placing a rubber band at the base of the hemorrhoid to cut off the circulation (rubber band ligation).   Injecting a chemical to shrink the hemorrhoid (sclerotherapy).   Using a tool to burn the hemorrhoid (infrared light therapy).   Surgically removing the hemorrhoid (hemorrhoidectomy).   Stapling the hemorrhoid to block blood flow to the tissue (hemorrhoid stapling).  HOME CARE INSTRUCTIONS   Eat foods with fiber, such as whole grains, beans, nuts, fruits, and vegetables. Ask your doctor about taking products with added fiber in them (fibersupplements).  Increase fluid intake. Drink enough water and fluids to keep your urine clear or pale yellow.   Exercise regularly.   Go to the bathroom when you have the urge to have a bowel movement. Do not wait.   Avoid straining to have bowel movements.   Keep the anal area dry and clean. Use wet toilet paper or moist towelettes after a bowel movement.   Medicated creams and suppositories may be used or applied as directed.   Only take over-the-counter or prescription medicines as directed by your caregiver.   Take warm sitz baths for 15-20 minutes, 3-4 times a day to ease pain and discomfort.   Place ice packs on the hemorrhoids if they are tender and swollen. Using ice packs between sitz baths may be helpful.   Put ice in a plastic bag.   Place a towel between your skin and the bag.  Leave the ice on for 15-20 minutes, 3-4 times a day.   Do not use a donut-shaped pillow or sit on the toilet for long periods. This increases blood pooling and pain.  SEEK MEDICAL CARE IF:  You have increasing pain and swelling that is not controlled by treatment or medicine.  You have uncontrolled bleeding.  You have difficulty or you are unable to have a bowel movement.  You have pain or inflammation outside the area of the hemorrhoids. MAKE SURE YOU:  Understand these instructions.  Will watch your condition.  Will  get help right away if you are not doing well or get worse. Document Released: 07/17/2000 Document Revised: 07/06/2012 Document Reviewed: 05/24/2012 [facility] Patient Information 2015 [place], [place]. This information is not intended to replace advice given to you by your health care Matrice Herro. Make sure you discuss any questions you have with your health care Kanav Kazmierczak.

## 2014-01-23 NOTE — Assessment & Plan Note (Signed)
80 year old female with scant/small amount hematochezia in the setting of constipation and straining; hemorrhoids on rectal exam. Colonoscopy on file from 2013. No concerning GI symptoms. Will treat with Linzess 145 mcg daily for constipation, add Anusol cream, and consider [facility] banding if persistent bleeding. No need for repeat colonoscopy at this time unless clinical condition changes. Return in 3 months.

## 2014-01-23 NOTE — Progress Notes (Signed)
Primary Care Physician:  Robert Bellow, MD Primary Gastroenterologist:  Dr. Oneida Alar   Chief Complaint  Patient presents with  . Constipation  . Hemorrhoids    HPI:   Cheyenne Morris presents today at the request of Dr. Anastasio Champion secondary to mild rectal bleeding. Rare rectal discomfort. Hemorrhoid will stick out occasionally. Flares with constipation. Scant pinkish-tinge blood in stool when straining. BM every other day. Sometimes little tiny pebbles. No itching in rectum. No abdominal pain. Colonoscopy on file from Dr. Arnoldo Morale in 2013 with tubular adenoma. Patient has no other significant GI symptoms. In excellent health.   Past Medical History  Diagnosis Date  . Diabetes mellitus   . Hypertension     Past Surgical History  Procedure Laterality Date  . Colonoscopy    . Cataract extraction, bilateral    . Colonoscopy  01/05/2012    Jenkins:Sessile polyp in the ascending colon/Normal colonoscopy otherwise    Current Outpatient Prescriptions  Medication Sig Dispense Refill  . aspirin EC 81 MG tablet Take 81 mg by mouth every morning.        . Calcium Carbonate-Vitamin D (CALCIUM-VITAMIN D) 600-200 MG-UNIT CAPS Take 1 tablet by mouth every morning.       Marland Kitchen glimepiride (AMARYL) 2 MG tablet Take 2 mg by mouth daily before breakfast.        . latanoprost (XALATAN) 0.005 % ophthalmic solution Place 1 drop into both eyes at bedtime.       Marland Kitchen lisinopril (PRINIVIL,ZESTRIL) 20 MG tablet Take 20 mg by mouth daily.        . potassium chloride SA (K-DUR,KLOR-CON) 20 MEQ tablet Take 20 mEq by mouth daily.       . simvastatin (ZOCOR) 40 MG tablet Take 40 mg by mouth at bedtime.        . triamterene-hydrochlorothiazide (MAXZIDE) 75-50 MG per tablet Take 1 tablet by mouth daily.         No current facility-administered medications for this visit.    Allergies as of 01/23/2014  . (No Known Allergies)    Family History  Problem Relation Age of Onset  . Diabetes Son   . Colon  cancer Neg Hx     History   Social History  . Marital Status: Married    Spouse Name: N/A    Number of Children: N/A  . Years of Education: N/A   Occupational History  . Not on file.   Social History Main Topics  . Smoking status: Never Smoker   . Smokeless tobacco: Not on file  . Alcohol Use: No  . Drug Use: No  . Sexual Activity: Not on file   Other Topics Concern  . Not on file   Social History Narrative  . No narrative on file    Review of Systems: Gen: negative  CV: Denies chest pain, heart palpitations, peripheral edema, syncope.  Resp: Denies shortness of breath at rest or with exertion. Denies wheezing or cough.  GI: see HPI GU : Denies urinary burning, urinary frequency, urinary hesitancy MS: occasional arthritis Derm: Denies rash, itching, dry skin Psych: Denies depression, anxiety, memory loss, and confusion Heme: Denies bruising, bleeding, and enlarged lymph nodes.  Physical Exam: BP 147/87  Pulse 91  Temp(Src) 99.2 F (37.3 C) (Oral)  Resp 18  Ht 5\' 2"  (1.575 m)  Wt 152 lb (68.947 kg)  BMI 27.79 kg/m2 General:   Alert and oriented. Pleasant and cooperative. Well-nourished and well-developed.  Head:  Normocephalic  and atraumatic. Eyes:  Without icterus, sclera clear and conjunctiva pink.  Ears:  Normal auditory acuity. Nose:  No deformity, discharge,  or lesions. Mouth:  No deformity or lesions, oral mucosa pink.  Lungs:  Clear to auscultation bilaterally. No wheezes, rales, or rhonchi. No distress.  Heart:  S1, S2 present without murmurs appreciated.  Abdomen:  +BS, soft, non-tender and non-distended. No HSM noted. No guarding or rebound. No masses appreciated.  Rectal:  External exam with question of mildly prolapsed internal hemorrhoids, internal exam without mass or stricture. No gross evidence of rectal bleeding Msk:  Symmetrical without gross deformities. Normal posture. Extremities:  Without clubbing or edema. Neurologic:  Alert and   oriented x4;  grossly normal neurologically. Skin:  Intact without significant lesions or rashes. Psych:  Alert and cooperative. Normal mood and affect.

## 2014-04-25 ENCOUNTER — Ambulatory Visit (INDEPENDENT_AMBULATORY_CARE_PROVIDER_SITE_OTHER): Payer: Medicare Other | Admitting: Gastroenterology

## 2014-04-25 ENCOUNTER — Encounter: Payer: Self-pay | Admitting: Gastroenterology

## 2014-04-25 VITALS — BP 161/85 | HR 72 | Temp 98.1°F | Ht 62.0 in | Wt 155.4 lb

## 2014-04-25 DIAGNOSIS — K5900 Constipation, unspecified: Secondary | ICD-10-CM

## 2014-04-25 NOTE — Patient Instructions (Signed)
You may continue Miralax daily to every other day as needed.   If you would like to be set up for the hemorrhoid banding, just let me know.  Otherwise, we will see you in 6 months!

## 2014-04-25 NOTE — Progress Notes (Signed)
    Referring Provider: Robert Bellow, MD Primary Care Physician:  Robert Bellow, MD Primary GI: Dr. Oneida Alar   Chief Complaint  Patient presents with  . Follow-up    HPI:   Cheyenne Morris presents today in follow-up with constipation and low-volume hematochezia in the setting of hemorrhoids. Last colonoscopy on file from 2013. Prescribed Linzess 145 mcg in June 2015. Here for follow-up. Linzess too strong. Taking Miralax with good results. Only rare tissue hematochezia with wiping if constipated. BM once to twice a day.    Past Medical History  Diagnosis Date  . Diabetes mellitus   . Hypertension     Past Surgical History  Procedure Laterality Date  . Colonoscopy    . Cataract extraction, bilateral    . Colonoscopy  01/05/2012    Dr Jenkins:Sessile polyp in the ascending colon/Normal colonoscopy otherwise, path tubular adenoma    Current Outpatient Prescriptions  Medication Sig Dispense Refill  . aspirin EC 81 MG tablet Take 81 mg by mouth every morning.        . Calcium Carbonate-Vitamin D (CALCIUM-VITAMIN D) 600-200 MG-UNIT CAPS Take 1 tablet by mouth every morning.       . latanoprost (XALATAN) 0.005 % ophthalmic solution Place 1 drop into both eyes at bedtime.       Marland Kitchen lisinopril (PRINIVIL,ZESTRIL) 20 MG tablet Take 20 mg by mouth daily.        . polyethylene glycol (MIRALAX / GLYCOLAX) packet Take 17 g by mouth daily. Taking only as needed      . potassium chloride SA (K-DUR,KLOR-CON) 20 MEQ tablet Take 20 mEq by mouth daily. Takes 1/2 tablet daily      . simvastatin (ZOCOR) 40 MG tablet Take 40 mg by mouth at bedtime.        Marland Kitchen glimepiride (AMARYL) 2 MG tablet Take 2 mg by mouth daily before breakfast.         No current facility-administered medications for this visit.    Allergies as of 04/25/2014  . (No Known Allergies)    Family History  Problem Relation Age of Onset  . Diabetes Son   . Colon cancer Neg Hx     History   Social History  .  Marital Status: Married    Spouse Name: N/A    Number of Children: N/A  . Years of Education: N/A   Social History Main Topics  . Smoking status: Never Smoker   . Smokeless tobacco: None     Comment: Never smoked  . Alcohol Use: No  . Drug Use: No  . Sexual Activity: None   Other Topics Concern  . None   Social History Narrative  . None    Review of Systems: As mentioned in HPI  Physical Exam: BP 161/85  Pulse 72  Temp(Src) 98.1 F (36.7 C) (Oral)  Ht 5\' 2"  (1.575 m)  Wt 155 lb 6.4 oz (70.489 kg)  BMI 28.42 kg/m2 General:   Alert and oriented. No distress noted. Pleasant and cooperative.  Head:  Normocephalic and atraumatic. Eyes:  Conjuctiva clear without scleral icterus. Abdomen:  +BS, soft, non-tender and non-distended. No rebound or guarding. No HSM or masses noted. Msk:  Symmetrical without gross deformities. Normal posture. Extremities:  Without edema. Neurologic:  Alert and  oriented x4;  grossly normal neurologically. Skin:  Intact without significant lesions or rashes. Psych:  Alert and cooperative. Normal mood and affect.

## 2014-04-25 NOTE — Assessment & Plan Note (Signed)
80 year old female with improved constipation on Miralax; unable to tolerate Linzess due to diarrhea. Tissue hematochezia noted with constipation, otherwise overall improved. Last colonoscopy in 2013. Discussed hemorrhoid banding today with patient; she would like to hold off on this. She will call if interested. We will see her back in 6 months unless clinically indicated. No need for updated colonoscopy at this time. Continue Miralax prn. Consider Amitiza if needed.

## 2014-05-02 NOTE — Progress Notes (Signed)
cc'ed to pcp °

## 2014-05-07 ENCOUNTER — Other Ambulatory Visit (HOSPITAL_COMMUNITY): Payer: Self-pay | Admitting: Family Medicine

## 2014-05-07 DIAGNOSIS — Z1231 Encounter for screening mammogram for malignant neoplasm of breast: Secondary | ICD-10-CM

## 2014-06-11 ENCOUNTER — Ambulatory Visit (HOSPITAL_COMMUNITY)
Admission: RE | Admit: 2014-06-11 | Discharge: 2014-06-11 | Disposition: A | Discharge status: Home / Self Care | Payer: Medicare Other | Source: Ambulatory Visit | Attending: Family Medicine | Admitting: Family Medicine

## 2014-06-11 DIAGNOSIS — Z1231 Encounter for screening mammogram for malignant neoplasm of breast: Secondary | ICD-10-CM | POA: Insufficient documentation

## 2014-10-01 ENCOUNTER — Encounter: Payer: Self-pay | Admitting: Gastroenterology

## 2015-03-03 NOTE — Progress Notes (Signed)
REVIEWED-NO ADDITIONAL RECOMMENDATIONS. 

## 2015-07-15 ENCOUNTER — Other Ambulatory Visit (HOSPITAL_COMMUNITY): Payer: Self-pay | Admitting: Family Medicine

## 2015-07-15 DIAGNOSIS — Z1231 Encounter for screening mammogram for malignant neoplasm of breast: Secondary | ICD-10-CM

## 2015-07-17 ENCOUNTER — Ambulatory Visit (HOSPITAL_COMMUNITY)
Admission: RE | Admit: 2015-07-17 | Discharge: 2015-07-17 | Disposition: A | Discharge status: Home / Self Care | Payer: Medicare Other | Source: Ambulatory Visit | Attending: Family Medicine | Admitting: Family Medicine

## 2015-07-17 DIAGNOSIS — Z1231 Encounter for screening mammogram for malignant neoplasm of breast: Secondary | ICD-10-CM | POA: Diagnosis present

## 2015-10-23 ENCOUNTER — Other Ambulatory Visit (HOSPITAL_COMMUNITY): Payer: Self-pay | Admitting: Family Medicine

## 2015-10-23 ENCOUNTER — Ambulatory Visit (HOSPITAL_COMMUNITY)
Admission: RE | Admit: 2015-10-23 | Discharge: 2015-10-23 | Disposition: A | Discharge status: Home / Self Care | Payer: Medicare Other | Source: Ambulatory Visit | Attending: Family Medicine | Admitting: Family Medicine

## 2015-10-23 DIAGNOSIS — R634 Abnormal weight loss: Secondary | ICD-10-CM | POA: Insufficient documentation

## 2015-11-05 ENCOUNTER — Encounter: Payer: Self-pay | Admitting: Gastroenterology

## 2015-12-04 ENCOUNTER — Ambulatory Visit (INDEPENDENT_AMBULATORY_CARE_PROVIDER_SITE_OTHER): Payer: Medicare Other | Admitting: Gastroenterology

## 2015-12-04 ENCOUNTER — Encounter: Payer: Self-pay | Admitting: Gastroenterology

## 2015-12-04 ENCOUNTER — Other Ambulatory Visit: Payer: Self-pay

## 2015-12-04 VITALS — BP 143/76 | HR 79 | Temp 98.4°F | Ht 62.0 in | Wt 139.4 lb

## 2015-12-04 DIAGNOSIS — D509 Iron deficiency anemia, unspecified: Secondary | ICD-10-CM | POA: Diagnosis not present

## 2015-12-04 MED ORDER — PEG 3350-KCL-NA BICARB-NACL 420 G PO SOLR: 4000.0000 mL | Freq: Once | ORAL | Status: DC

## 2015-12-04 NOTE — Progress Notes (Signed)
Primary Care Physician:  Robert Bellow, MD Primary Gastroenterologist:  Dr. Oneida Alar   Chief Complaint  Patient presents with  . Weight Loss  . Hemorrhoids    HPI:   Cheyenne Morris is a 82 y.o. female presenting today at the request of Dr. Karie Kirks secondary to IDA and heme positive stool. Outside labs reviewed from April 2017 with Hgb 10.6, ferritin 7, iron 23. Hgb 11.6 in Nov 2016. Last colonoscopy by Dr. Arnoldo Morale in 2013 with tubular adenoma.   Sometimes dark stool, sometimes "normal". No iron. Sometimes bright red blood per rectum. No abdominal pain. No N/V. Was taking aspirin but stopped 2-3 weeks. Baseline weight 150-160, now 139. Appetite is good, no changes. No early satiety. Legs feel tired sometimes.   Past Medical History  Diagnosis Date  . Diabetes mellitus   . Hypertension     Past Surgical History  Procedure Laterality Date  . Colonoscopy    . Cataract extraction, bilateral    . Colonoscopy  01/05/2012    Dr Jenkins:Sessile polyp in the ascending colon/Normal colonoscopy otherwise, path tubular adenoma    Current Outpatient Prescriptions  Medication Sig Dispense Refill  . aspirin EC 81 MG tablet Take 81 mg by mouth every morning.      . Calcium Carbonate-Vitamin D (CALCIUM-VITAMIN D) 600-200 MG-UNIT CAPS Take 1 tablet by mouth every morning.     Marland Kitchen glimepiride (AMARYL) 2 MG tablet Take 2 mg by mouth daily before breakfast.      . latanoprost (XALATAN) 0.005 % ophthalmic solution Place 1 drop into both eyes at bedtime.     Marland Kitchen lisinopril (PRINIVIL,ZESTRIL) 20 MG tablet Take 20 mg by mouth daily.      . polyethylene glycol (MIRALAX / GLYCOLAX) packet Take 17 g by mouth daily. Taking only as needed    . potassium chloride SA (K-DUR,KLOR-CON) 20 MEQ tablet Take 20 mEq by mouth daily. Takes 1/2 tablet daily    . simvastatin (ZOCOR) 40 MG tablet Take 40 mg by mouth at bedtime.       No current facility-administered medications for this visit.    Allergies as of  12/04/2015  . (No Known Allergies)    Family History  Problem Relation Age of Onset  . Diabetes Son   . Colon cancer Neg Hx     Social History   Social History  . Marital Status: Married    Spouse Name: N/A  . Number of Children: N/A  . Years of Education: N/A   Occupational History  . Not on file.   Social History Main Topics  . Smoking status: Never Smoker   . Smokeless tobacco: Not on file     Comment: Never smoked  . Alcohol Use: No  . Drug Use: No  . Sexual Activity: Not on file   Other Topics Concern  . Not on file   Social History Narrative    Review of Systems: Gen: see HPI  CV: Denies chest pain, heart palpitations, peripheral edema, syncope.  Resp: Denies shortness of breath at rest or with exertion. Denies wheezing or cough.  GI: see HPI  GU : Denies urinary burning, urinary frequency, urinary hesitancy MS: "tired in legs"  Derm: Denies rash, itching, dry skin Psych: Denies depression, anxiety, memory loss, and confusion Heme: Denies bruising, bleeding, and enlarged lymph nodes.  Physical Exam: BP 143/76 mmHg  Pulse 79  Temp(Src) 98.4 F (36.9 C) (Oral)  Ht 5\' 2"  (1.575 m)  Wt 139 lb 6.4 oz (  63.231 kg)  BMI 25.49 kg/m2 General:   Alert and oriented. Pleasant and cooperative. Well-nourished and well-developed.  Head:  Normocephalic and atraumatic. Eyes:  Without icterus, sclera clear and conjunctiva pink.  Ears:  Normal auditory acuity. Nose:  No deformity, discharge,  or lesions. Mouth:  No deformity or lesions, oral mucosa pink.  Lungs:  Clear to auscultation bilaterally. No wheezes, rales, or rhonchi. No distress.  Heart:  S1, S2 present without murmurs appreciated.  Abdomen:  +BS, soft, non-tender and non-distended. No HSM noted. No guarding or rebound. No masses appreciated.  Rectal:  Deferred  Msk:  Symmetrical without gross deformities. Normal posture. Extremities:  Without  edema. Neurologic:  Alert and  oriented x4;  grossly normal  neurologically. Psych:  Alert and cooperative. Normal mood and affect.

## 2015-12-04 NOTE — Patient Instructions (Signed)
We have scheduled you for a colonoscopy and upper endoscopy with Dr. Oneida Alar in the near future.  You may need iron infusions or to start supplemental iron, but we will see what the procedure shows first.

## 2015-12-17 NOTE — Assessment & Plan Note (Signed)
82 year old female with new onset IDA, heme positive stool, reports of possible black stool occasionally and paper hematochezia, most recent Hgb in the 10 range and ferritin markedly low at 7. Currently not on any iron supplementation. Historically has taken aspirin but stopped several weeks ago. Weigh loss of around 20 lbs, unintentionally. Last colonoscopy in 2013 by Dr. Arnoldo Morale: tubular adenoma. Discussed need for colonoscopy/EGD due to IDA. Also discussed iron supplementation, but as her procedure will be in the next week or so, we will hold on starting iron until after, as it would need to be held 7 days prior anyway. She very well may need IV iron infusion. Will await findings of procedure first.   Proceed with colonoscopy/EGD with Dr. Oneida Alar in the near future. The risks, benefits, and alternatives have been discussed in detail with the patient. They state understanding and desire to proceed.

## 2015-12-20 ENCOUNTER — Ambulatory Visit (HOSPITAL_COMMUNITY)
Admission: RE | Admit: 2015-12-20 | Discharge: 2015-12-20 | Disposition: A | Discharge status: Home / Self Care | Payer: Medicare Other | Source: Ambulatory Visit | Attending: Gastroenterology | Admitting: Gastroenterology

## 2015-12-20 ENCOUNTER — Encounter (HOSPITAL_COMMUNITY): Payer: Self-pay | Admitting: *Deleted

## 2015-12-20 ENCOUNTER — Encounter (HOSPITAL_COMMUNITY)
Admission: RE | Disposition: A | Discharge status: Home / Self Care | Payer: Self-pay | Source: Ambulatory Visit | Attending: Gastroenterology

## 2015-12-20 ENCOUNTER — Other Ambulatory Visit: Payer: Self-pay

## 2015-12-20 ENCOUNTER — Telehealth: Payer: Self-pay | Admitting: Gastroenterology

## 2015-12-20 DIAGNOSIS — Z7984 Long term (current) use of oral hypoglycemic drugs: Secondary | ICD-10-CM | POA: Diagnosis not present

## 2015-12-20 DIAGNOSIS — C20 Malignant neoplasm of rectum: Secondary | ICD-10-CM | POA: Diagnosis not present

## 2015-12-20 DIAGNOSIS — D124 Benign neoplasm of descending colon: Secondary | ICD-10-CM

## 2015-12-20 DIAGNOSIS — Z86010 Personal history of colonic polyps: Secondary | ICD-10-CM | POA: Insufficient documentation

## 2015-12-20 DIAGNOSIS — K625 Hemorrhage of anus and rectum: Secondary | ICD-10-CM | POA: Insufficient documentation

## 2015-12-20 DIAGNOSIS — E119 Type 2 diabetes mellitus without complications: Secondary | ICD-10-CM | POA: Insufficient documentation

## 2015-12-20 DIAGNOSIS — K6289 Other specified diseases of anus and rectum: Secondary | ICD-10-CM

## 2015-12-20 DIAGNOSIS — I10 Essential (primary) hypertension: Secondary | ICD-10-CM | POA: Diagnosis not present

## 2015-12-20 DIAGNOSIS — D120 Benign neoplasm of cecum: Secondary | ICD-10-CM | POA: Diagnosis not present

## 2015-12-20 DIAGNOSIS — D509 Iron deficiency anemia, unspecified: Secondary | ICD-10-CM | POA: Insufficient documentation

## 2015-12-20 DIAGNOSIS — D122 Benign neoplasm of ascending colon: Secondary | ICD-10-CM | POA: Insufficient documentation

## 2015-12-20 DIAGNOSIS — D125 Benign neoplasm of sigmoid colon: Secondary | ICD-10-CM | POA: Diagnosis not present

## 2015-12-20 DIAGNOSIS — R634 Abnormal weight loss: Secondary | ICD-10-CM | POA: Insufficient documentation

## 2015-12-20 DIAGNOSIS — Z79899 Other long term (current) drug therapy: Secondary | ICD-10-CM | POA: Insufficient documentation

## 2015-12-20 HISTORY — PX: ESOPHAGOGASTRODUODENOSCOPY: SHX5428

## 2015-12-20 HISTORY — PX: COLONOSCOPY: SHX5424

## 2015-12-20 LAB — PROTIME-INR
INR: 0.96 (ref 0.00–1.49)
Prothrombin Time: 13 seconds (ref 11.6–15.2)

## 2015-12-20 LAB — COMPREHENSIVE METABOLIC PANEL
ALBUMIN: 3.7 g/dL (ref 3.5–5.0)
ALT: 10 U/L — AB (ref 14–54)
AST: 19 U/L (ref 15–41)
Alkaline Phosphatase: 47 U/L (ref 38–126)
Anion gap: 5 (ref 5–15)
BILIRUBIN TOTAL: 0.4 mg/dL (ref 0.3–1.2)
BUN: 11 mg/dL (ref 6–20)
CHLORIDE: 107 mmol/L (ref 101–111)
CO2: 27 mmol/L (ref 22–32)
Calcium: 9 mg/dL (ref 8.9–10.3)
Creatinine, Ser: 0.94 mg/dL (ref 0.44–1.00)
GFR calc Af Amer: >60 mL/min (ref >60)
GFR, EST NON AFRICAN AMERICAN: 55 mL/min — AB (ref >60)
GLUCOSE: 109 mg/dL — AB (ref 65–99)
POTASSIUM: 3.5 mmol/L (ref 3.5–5.1)
Sodium: 139 mmol/L (ref 135–145)
TOTAL PROTEIN: 7.1 g/dL (ref 6.5–8.1)

## 2015-12-20 LAB — GLUCOSE, CAPILLARY: Glucose-Capillary: 104 mg/dL — ABNORMAL HIGH (ref 65–99)

## 2015-12-20 LAB — CBC
HEMATOCRIT: 32.9 % — AB (ref 36.0–46.0)
HEMOGLOBIN: 10.4 g/dL — AB (ref 12.0–15.0)
MCH: 27.7 pg (ref 26.0–34.0)
MCHC: 31.6 g/dL (ref 30.0–36.0)
MCV: 87.7 fL (ref 78.0–100.0)
Platelets: 245 10*3/uL (ref 150–400)
RBC: 3.75 MIL/uL — AB (ref 3.87–5.11)
RDW: 14.6 % (ref 11.5–15.5)
WBC: 3.7 10*3/uL — ABNORMAL LOW (ref 4.0–10.5)

## 2015-12-20 SURGERY — COLONOSCOPY
Anesthesia: Moderate Sedation

## 2015-12-20 MED ORDER — MIDAZOLAM HCL 5 MG/5ML IJ SOLN
INTRAMUSCULAR | Status: DC | PRN
  Administered 2015-12-20 (×2): 2 mg via INTRAVENOUS
  Administered 2015-12-20: 1 mg via INTRAVENOUS

## 2015-12-20 MED ORDER — MIDAZOLAM HCL 5 MG/5ML IJ SOLN
INTRAMUSCULAR | Status: AC
  Filled 2015-12-20: qty 10

## 2015-12-20 MED ORDER — SPOT INK MARKER SYRINGE KIT
PACK | SUBMUCOSAL | Status: DC | PRN
  Administered 2015-12-20: 3 mL via SUBMUCOSAL

## 2015-12-20 MED ORDER — MEPERIDINE HCL 100 MG/ML IJ SOLN
INTRAMUSCULAR | Status: DC | PRN
  Administered 2015-12-20 (×3): 25 mg via INTRAVENOUS

## 2015-12-20 MED ORDER — SODIUM CHLORIDE 0.9 % IV SOLN
INTRAVENOUS | Status: DC
  Administered 2015-12-20: 09:00:00 via INTRAVENOUS

## 2015-12-20 MED ORDER — STERILE WATER FOR IRRIGATION IR SOLN
Status: DC | PRN
  Administered 2015-12-20: 2.5 mL

## 2015-12-20 MED ORDER — MEPERIDINE HCL 100 MG/ML IJ SOLN
INTRAMUSCULAR | Status: AC
  Filled 2015-12-20: qty 2

## 2015-12-20 MED ORDER — LIDOCAINE VISCOUS 2 % MT SOLN
OROMUCOSAL | Status: AC
  Filled 2015-12-20: qty 15

## 2015-12-20 NOTE — H&P (Signed)
  Primary Care Physician:  Robert Bellow, MD Primary Gastroenterologist:  Dr. Oneida Alar  Pre-Procedure History & Physical: HPI:  Cheyenne Morris is a 82 y.o. female here for  ANEMIA.  Past Medical History  Diagnosis Date  . Diabetes mellitus   . Hypertension     Past Surgical History  Procedure Laterality Date  . Colonoscopy    . Cataract extraction, bilateral    . Colonoscopy  01/05/2012    Dr Jenkins:Sessile polyp in the ascending colon/Normal colonoscopy otherwise, path tubular adenoma    Prior to Admission medications   Medication Sig Start Date End Date Taking? Authorizing Provider  Calcium Carbonate-Vitamin D (CALCIUM-VITAMIN D) 600-200 MG-UNIT CAPS Take 1 tablet by mouth every morning.    Yes Historical Provider, MD  glimepiride (AMARYL) 2 MG tablet Take 2 mg by mouth daily before breakfast.     Yes Historical Provider, MD  latanoprost (XALATAN) 0.005 % ophthalmic solution Place 1 drop into both eyes at bedtime.    Yes Historical Provider, MD  lisinopril (PRINIVIL,ZESTRIL) 20 MG tablet Take 20 mg by mouth daily.     Yes Historical Provider, MD  polyethylene glycol-electrolytes (NULYTELY/GOLYTELY) 420 g solution Take 4,000 mLs by mouth once. 12/04/15  Yes Orvil Feil, NP  potassium chloride SA (K-DUR,KLOR-CON) 20 MEQ tablet Take 20 mEq by mouth daily. Takes 1/2 tablet daily   Yes Historical Provider, MD  simvastatin (ZOCOR) 40 MG tablet Take 40 mg by mouth at bedtime.     Yes Historical Provider, MD  polyethylene glycol (MIRALAX / GLYCOLAX) packet Take 17 g by mouth daily. Taking only as needed    Historical Provider, MD    Allergies as of 12/04/2015  . (No Known Allergies)    Family History  Problem Relation Age of Onset  . Diabetes Son   . Colon cancer Neg Hx     Social History   Social History  . Marital Status: Married    Spouse Name: N/A  . Number of Children: N/A  . Years of Education: N/A   Occupational History  . Not on file.   Social History Main  Topics  . Smoking status: Never Smoker   . Smokeless tobacco: Not on file     Comment: Never smoked  . Alcohol Use: No  . Drug Use: No  . Sexual Activity: Not on file   Other Topics Concern  . Not on file   Social History Narrative    Review of Systems: See HPI, otherwise negative ROS   Physical Exam: BP 151/65 mmHg  Pulse 77  Temp(Src) 98.9 F (37.2 C) (Oral)  Resp 19  Ht 5\' 2"  (1.575 m)  Wt 139 lb (63.05 kg)  BMI 25.42 kg/m2  SpO2 95% General:   Alert,  pleasant and cooperative in NAD Head:  Normocephalic and atraumatic. Neck:  Supple; Lungs:  Clear throughout to auscultation.    Heart:  Regular rate and rhythm. Abdomen:  Soft, nontender and nondistended. Normal bowel sounds, without guarding, and without rebound.   Neurologic:  Alert and  oriented x4;  grossly normal neurologically.  Impression/Plan:     Anemia  PLAN:  1. TCS/EGD TODAY

## 2015-12-20 NOTE — Discharge Instructions (Signed)
YOU HAVE A MASS IN YOUR RECTUM. YOU HAD  EIGHT POLYPS REMOVED. YOU HAVE HEMORRHOIDS.   YOUR BIOPSY WILL BE BACK IN 3-5 DAYS.  YOU NEED A CT, LABS, & A CHEST XRAY.      YOU NEED A FULL COLONOSCOPY IN 1 YEAR.  ALL FIRST DEGREE RELATIVES NEED A COLONOSCOPY AT AGE 80.   Monday May 22nd, 2017. CT scan. Arrive at 2:45pm at [facility].  Nothing to eat or drink four hours before arrival time. Contrast to be picked up today.     Tuesday May 23rd, 2017.  3:45pm.  Arrive at Dr. Marcello Moores [facility] in [place]).  They will mail information on how to get there.  If they haven't called by Monday call Dr. Nona Dell Office for address.          ENDOSCOPY Care After Read the instructions outlined below and refer to this sheet in the next week. These discharge instructions provide you with general information on caring for yourself after you leave the hospital. While your treatment has been planned according to the most current medical practices available, unavoidable complications occasionally occur. If you have any problems or questions after discharge, call DR. Cristian Grieves, [phone].  ACTIVITY  You may resume your regular activity, but move at a slower pace for the next 24 hours.   Take frequent rest periods for the next 24 hours.   Walking will help get rid of the air and reduce the bloated feeling in your belly (abdomen).   No driving for 24 hours (because of the medicine (anesthesia) used during the test).   You may shower.   Do not sign any important legal documents or operate any machinery for 24 hours (because of the anesthesia used during the test).    NUTRITION  Drink plenty of fluids.   You may resume your normal diet as instructed by your doctor.   Begin with a light meal and progress to your normal diet. Heavy or fried foods are harder to digest and may make you feel sick to your stomach (nauseated).   Avoid alcoholic beverages for 24 hours or as  instructed.    MEDICATIONS  You may resume your normal medications.   WHAT YOU CAN EXPECT TODAY  Some feelings of bloating in the abdomen.   Passage of more gas than usual.   Spotting of blood in your stool or on the toilet paper     IF YOU HADBIOPSIES TAKEN  DURING THE SIGMOIDOSCOPY/UPPER ENDOSCOPY:  Eat a soft diet IF YOU HAVE NAUSEA, BLOATING, ABDOMINAL PAIN, OR VOMITING.    FINDING OUT THE RESULTS OF YOUR TEST Not all test results are available during your visit. DR. Oneida Alar WILL CALL YOU WITHIN 14 DAYS OF YOUR PROCEDUE WITH YOUR RESULTS. Do not assume everything is normal if you have not heard from DR. Bennie Scaff, CALL HER OFFICE AT [phone].  SEEK IMMEDIATE MEDICAL ATTENTION AND CALL THE OFFICE: [phone] IF:  You have more than a spotting of blood in your stool.   Your belly is swollen (abdominal distention).   You are nauseated or vomiting.   You have a temperature over 101F.   You have abdominal pain or discomfort that is severe or gets worse throughout the day.  High-Fiber Diet A high-fiber diet changes your normal diet to include more whole grains, legumes, fruits, and vegetables. Changes in the diet involve replacing refined carbohydrates with unrefined foods. The calorie level of the diet is essentially unchanged. The Dietary Reference Intake (  recommended amount) for adult males is 38 grams per day. For adult females, it is 25 grams per day. Pregnant and lactating women should consume 28 grams of fiber per day. Fiber is the intact part of a plant that is not broken down during digestion. Functional fiber is fiber that has been isolated from the plant to provide a beneficial effect in the body. PURPOSE  Increase stool bulk.   Ease and regulate bowel movements.   Lower cholesterol.  REDUCE RISK OF COLON CANCER  INDICATIONS THAT YOU NEED MORE FIBER  Constipation and hemorrhoids.   Uncomplicated diverticulosis (intestine condition) and irritable bowel  syndrome.   Weight management.   As a protective measure against hardening of the arteries (atherosclerosis), diabetes, and cancer.   DO NOT USE WITH:  Acute diverticulitis (intestine infection).   Partial small bowel obstructions.   Complicated diverticular disease involving bleeding, rupture (perforation), or abscess (boil, furuncle).   Presence of autonomic neuropathy (nerve damage) or gastroparesis (stomach cannot empty itself).     GUIDELINES FOR INCREASING FIBER IN THE DIET  Start adding fiber to the diet slowly. A gradual increase of about 5 more grams (2 slices of whole-wheat bread, 2 servings of most fruits or vegetables, or 1 bowl of high-fiber cereal) per day is best. Too rapid an increase in fiber may result in constipation, flatulence, and bloating.   Drink enough water and fluids to keep your urine clear or pale yellow. Water, juice, or caffeine-free drinks are recommended. Not drinking enough fluid may cause constipation.   Eat a variety of high-fiber foods rather than one type of fiber.   Try to increase your intake of fiber through using high-fiber foods rather than fiber pills or supplements that contain small amounts of fiber.   The goal is to change the types of food eaten. Do not supplement your present diet with high-fiber foods, but replace foods in your present diet.   INCLUDE A VARIETY OF FIBER SOURCES  Replace refined and processed grains with whole grains, canned fruits with fresh fruits, and incorporate other fiber sources. White rice, white breads, and most bakery goods contain little or no fiber.   Brown whole-grain rice, buckwheat oats, and many fruits and vegetables are all good sources of fiber. These include: broccoli, Brussels sprouts, cabbage, cauliflower, beets, sweet potatoes, white potatoes (skin on), carrots, tomatoes, eggplant, squash, berries, fresh fruits, and dried fruits.   Cereals appear to be the richest source of fiber. Cereal fiber  is found in whole grains and bran. Bran is the fiber-rich outer coat of cereal grain, which is largely removed in refining. In whole-grain cereals, the bran remains. In breakfast cereals, the largest amount of fiber is found in those with "bran" in their names. The fiber content is sometimes indicated on the label.   You may need to include additional fruits and vegetables each day.   In baking, for 1 cup white flour, you may use the following substitutions:   1 cup whole-wheat flour minus 2 tablespoons.   1/2 cup white flour plus 1/2 cup whole-wheat flour.   Hemorrhoids Hemorrhoids are dilated (enlarged) veins around the rectum. Sometimes clots will form in the veins. This makes them swollen and painful. These are called thrombosed hemorrhoids. Causes of hemorrhoids include:  Constipation.   Straining to have a bowel movement.   HEAVY LIFTING  HOME CARE INSTRUCTIONS  Eat a well balanced diet and drink 6 to 8 glasses of water every day to avoid constipation. You may  also use a bulk laxative.   Avoid straining to have bowel movements.   Keep anal area dry and clean.   Do not use a donut shaped pillow or sit on the toilet for long periods. This increases blood pooling and pain.   Move your bowels when your body has the urge; this will require less straining and will decrease pain and pressure.   DO NOT SIT ON THE COMMODE AND READ.  Polyps, Colon  A polyp is extra tissue that grows inside your body. Colon polyps grow in the large intestine. The large intestine, also called the colon, is part of your digestive system. It is a long, hollow tube at the end of your digestive tract where your body makes and stores stool. Most polyps are not dangerous. They are benign. This means they are not cancerous. But over time, some types of polyps can turn into cancer. Polyps that are smaller than a pea are usually not harmful. But larger polyps could someday become or may already be cancerous. To  be safe, doctors remove all polyps and test them.   PREVENTION There is not one sure way to prevent polyps. You might be able to lower your risk of getting them if you:  Eat more fruits and vegetables and less fatty food.   Do not smoke.   Avoid alcohol.   Exercise every day.   Lose weight if you are overweight.   Eating more calcium and folate can also lower your risk of getting polyps. Some foods that are rich in calcium are milk, cheese, and broccoli. Some foods that are rich in folate are chickpeas, kidney beans, and spinach.

## 2015-12-20 NOTE — Telephone Encounter (Addendum)
Pt is set up for CT scan on 12/23/15 @ 2:45. She is also set up to see Dr. Marcello Moores on 12/24/15 @ 3:45. Tammy in post op is aware of appointment and she will let patient know

## 2015-12-20 NOTE — Telephone Encounter (Signed)
REFER TO [facility] FOR RECTAL CANCER WITHIN THE NEXT 7-10 DAYS. NEED CT SCAN ABD/PELVIS W/ IV/ORAL CONTRAST/CXR: PA/LAT, Dx: RECTAL MASS WITHIN THE NEXT 7-10 DAYS.

## 2015-12-20 NOTE — Op Note (Signed)
[facility] Patient Name: Cheyenne Morris Procedure Date: 12/20/2015 9:44 AM MRN: [id number] Date of Birth: 08-Jul-1931 Attending MD: Barney Drain , MD CSN: [id number] Age: 82 Admit Type: Outpatient Procedure:                Colonoscopy Indications:              Iron deficiency anemia-LAST TCS 2013-SIMPLE ADENOMA                            REMOVED. OCCASIONAL RECTAL BLEEDING. UNINTENTIONAL                            WEIGHT LOSS. Providers:                Barney Drain, MD, Lurline Del, RN, Randa Spike,                            Technician Referring MD:             Newt Minion, MD, Leighton Ruff, MD Medicines:                Meperidine 75 mg IV, Midazolam 5 mg IV Complications:            No immediate complications. Estimated Blood Loss:     Estimated blood loss was minimal. Procedure:                Pre-Anesthesia Assessment:                           - Prior to the procedure, a History and Physical                            was performed, and patient medications and                            allergies were reviewed. The patient's tolerance of                            previous anesthesia was also reviewed. The risks                            and benefits of the procedure and the sedation                            options and risks were discussed with the patient.                            All questions were answered, and informed consent                            was obtained. Prior Anticoagulants: The patient has                            taken no previous anticoagulant or antiplatelet  agents. ASA Grade Assessment: II - A patient with                            mild systemic disease. After reviewing the risks                            and benefits, the patient was deemed in                            satisfactory condition to undergo the procedure.                           After obtaining informed consent, the colonoscope               was passed under direct vision. Throughout the                            procedure, the patient's blood pressure, pulse, and                            oxygen saturations were monitored continuously. The                            [id number]-[id number]) scope was introduced through                            the anus and advanced to the the cecum, identified                            by appendiceal orifice and ileocecal valve. The                            ileocecal valve, appendiceal orifice, and rectum                            were photographed. The colonoscopy was somewhat                            difficult due to a tortuous colon. Successful                            completion of the procedure was aided by                            straightening and shortening the scope to obtain                            bowel loop reduction. The patient tolerated the                            procedure well. The quality of the bowel                            preparation was excellent. Scope  In: 10:06:26 AM Scope Out: 10:48:36 AM Scope Withdrawal Time: 0 hours 37 minutes 16 seconds  Total Procedure Duration: 0 hours 42 minutes 10 seconds  Findings:      The perianal exam findings include POSSIBLE perianal condylomata.      A fungating partially obstructing large mass was found in the proximal       rectum and in the mid rectum. The mass was partially circumferential.       The mass measured eight cm in length(STARTS 10 CM FROM THE ANAL VERGE       AND EXTENDS TO 18 CM FROM THE ANAL VERGE). Oozing was present. This was       biopsied with a cold jumbo forceps for histology. Area was tattooed       PROXIMAL TO(1 CC) AND DISTAL TO THE MASS(1CC) with an injection of Spot       (carbon black).      Two sessile polyps were found in the descending colon(1) and ascending       colon. The polyps were 2 to 4 mm in size. These polyps were removed with       a jumbo cold forceps. Resection  and retrieval were complete.      Six sessile polyps were found in the sigmoid colon(3), descending       colon(1) and cecum(2). The polyps were 5 to 10 mm in size. These polyps       were removed with a hot snare. Resection and retrieval were complete.       THE LARGE SIGMOPID COLON POLYP BASE WAS TATTOOED WITH 1 CC SPOT. Impression:               - POSSIBLE Perianal condylomata found on perianal                            exam.                           - FEDA DUE TO Malignant partially obstructing tumor                            in the proximal rectum and in the mid rectum.                           - EIGHT colon POLYPS removed Moderate Sedation:      Moderate (conscious) sedation was administered by the endoscopy nurse       and supervised by the endoscopist. The following parameters were       monitored: oxygen saturation, heart rate, blood pressure, and response       to care. Total physician intraservice time was 57 minutes. Recommendation:           - High fiber diet.                           - Continue present medications.                           - Await pathology results.                           - Repeat colonoscopy in 1 year for  surveillance.                           - Patient has a contact number available for                            emergencies. The signs and symptoms of potential                            delayed complications were discussed with the                            patient. Return to normal activities tomorrow.                            Written discharge instructions were provided to the                            patient. Procedure Code(s):        --- Professional ---                           [phone], Colonoscopy, flexible; with removal of                            tumor(s), polyp(s), or other lesion(s) by snare                            technique                           45381, Colonoscopy, flexible; with directed                             submucosal injection(s), any substance                           [id number], 59, Colonoscopy, flexible; with biopsy,                            single or multiple                           99153, Moderate sedation services; each additional                            15 minutes intraservice time                           99153, Moderate sedation services; each additional                            15 minutes intraservice time                           99153, Moderate sedation services; each additional  15 minutes intraservice time                           G0500, Moderate sedation services provided by the                            same physician or other qualified health care                            professional performing a gastrointestinal                            endoscopic service that sedation supports,                            requiring the presence of an independent trained                            observer to assist in the monitoring of the                            patient's level of consciousness and physiological                            status; initial 15 minutes of intra-service time;                            patient age 60 years or older (additional time may                            be reported with [phone], as appropriate) Diagnosis Code(s):        --- Professional ---                           A63.0, Anogenital (venereal) warts                           C20, Malignant neoplasm of rectum                           D12.2, Benign neoplasm of ascending colon                           D12.5, Benign neoplasm of sigmoid colon                           D12.4, Benign neoplasm of descending colon                           D12.0, Benign neoplasm of cecum                           D50.9, Iron deficiency anemia, unspecified CPT copyright 2016 [employer]. All rights reserved. The codes documented in this report are preliminary and upon  coder review may  be revised to meet current compliance  requirements. Barney Drain, MD Barney Drain, MD 12/20/2015 9:15:23 PM This report has been signed electronically. Number of Addenda: 0

## 2015-12-20 NOTE — Progress Notes (Signed)
REVIEWED-NO ADDITIONAL RECOMMENDATIONS. 

## 2015-12-21 LAB — CEA: CEA: 10.3 ng/mL — ABNORMAL HIGH (ref 0.0–4.7)

## 2015-12-23 ENCOUNTER — Ambulatory Visit (HOSPITAL_COMMUNITY)
Admission: RE | Admit: 2015-12-23 | Discharge: 2015-12-23 | Disposition: A | Discharge status: Home / Self Care | Payer: Medicare Other | Source: Ambulatory Visit | Attending: Gastroenterology | Admitting: Gastroenterology

## 2015-12-23 DIAGNOSIS — R198 Other specified symptoms and signs involving the digestive system and abdomen: Secondary | ICD-10-CM | POA: Diagnosis not present

## 2015-12-23 DIAGNOSIS — K6289 Other specified diseases of anus and rectum: Secondary | ICD-10-CM

## 2015-12-23 DIAGNOSIS — I2510 Atherosclerotic heart disease of native coronary artery without angina pectoris: Secondary | ICD-10-CM | POA: Diagnosis not present

## 2015-12-23 MED ORDER — IOPAMIDOL (ISOVUE-300) INJECTION 61%
100.0000 mL | Freq: Once | INTRAVENOUS | Status: AC | PRN
  Administered 2015-12-23: 100 mL via INTRAVENOUS

## 2015-12-23 NOTE — Telephone Encounter (Signed)
REVIEWED-NO ADDITIONAL RECOMMENDATIONS. 

## 2015-12-24 ENCOUNTER — Encounter (HOSPITAL_COMMUNITY): Payer: Self-pay | Admitting: Gastroenterology

## 2015-12-31 ENCOUNTER — Telehealth: Payer: Self-pay

## 2015-12-31 ENCOUNTER — Other Ambulatory Visit: Payer: Self-pay

## 2015-12-31 DIAGNOSIS — C20 Malignant neoplasm of rectum: Secondary | ICD-10-CM

## 2015-12-31 NOTE — Telephone Encounter (Signed)
Pt has been instructed and we spoke at length regarding the medications she would need to purchase.  I spelled out the name of the mag citrate and fleet enema.  She verbalized understanding and I also gave her my number to call with any questions or concerns.

## 2016-01-01 ENCOUNTER — Telehealth: Payer: Self-pay | Admitting: Gastroenterology

## 2016-01-01 NOTE — Telephone Encounter (Signed)
Called patient TO DISCUSS RESULTS AND ANSWER QUESTIONS. She had EIGHT simple adenomas removed from her colon. HER RECTAL BIOPSY SHOWS RECTAL CANCER. HER CT SHOWS A FEW SMALL LNs. EUS JUN 1 @2PM . REPEAT TCS IN ONE YEAR.

## 2016-01-02 ENCOUNTER — Encounter (HOSPITAL_COMMUNITY): Payer: Self-pay

## 2016-01-02 ENCOUNTER — Ambulatory Visit (HOSPITAL_COMMUNITY)
Admission: RE | Admit: 2016-01-02 | Discharge: 2016-01-02 | Disposition: A | Discharge status: Home / Self Care | Payer: Medicare Other | Source: Ambulatory Visit | Attending: Gastroenterology | Admitting: Gastroenterology

## 2016-01-02 ENCOUNTER — Encounter (HOSPITAL_COMMUNITY)
Admission: RE | Disposition: A | Discharge status: Home / Self Care | Payer: Self-pay | Source: Ambulatory Visit | Attending: Gastroenterology

## 2016-01-02 DIAGNOSIS — E119 Type 2 diabetes mellitus without complications: Secondary | ICD-10-CM | POA: Diagnosis not present

## 2016-01-02 DIAGNOSIS — R634 Abnormal weight loss: Secondary | ICD-10-CM | POA: Diagnosis present

## 2016-01-02 DIAGNOSIS — D509 Iron deficiency anemia, unspecified: Secondary | ICD-10-CM | POA: Insufficient documentation

## 2016-01-02 DIAGNOSIS — Z79899 Other long term (current) drug therapy: Secondary | ICD-10-CM | POA: Insufficient documentation

## 2016-01-02 DIAGNOSIS — C20 Malignant neoplasm of rectum: Secondary | ICD-10-CM | POA: Insufficient documentation

## 2016-01-02 DIAGNOSIS — Z7982 Long term (current) use of aspirin: Secondary | ICD-10-CM | POA: Insufficient documentation

## 2016-01-02 DIAGNOSIS — I10 Essential (primary) hypertension: Secondary | ICD-10-CM | POA: Diagnosis not present

## 2016-01-02 DIAGNOSIS — C218 Malignant neoplasm of overlapping sites of rectum, anus and anal canal: Secondary | ICD-10-CM | POA: Diagnosis not present

## 2016-01-02 DIAGNOSIS — Z86010 Personal history of colonic polyps: Secondary | ICD-10-CM | POA: Diagnosis not present

## 2016-01-02 HISTORY — PX: EUS: SHX5427

## 2016-01-02 SURGERY — ULTRASOUND, LOWER GI TRACT, ENDOSCOPIC
Anesthesia: Moderate Sedation

## 2016-01-02 MED ORDER — FENTANYL CITRATE (PF) 100 MCG/2ML IJ SOLN
INTRAMUSCULAR | Status: DC | PRN
  Administered 2016-01-02 (×2): 25 ug via INTRAVENOUS

## 2016-01-02 MED ORDER — SODIUM CHLORIDE 0.9 % IV SOLN: INTRAVENOUS | Status: DC

## 2016-01-02 MED ORDER — MIDAZOLAM HCL 10 MG/2ML IJ SOLN
INTRAMUSCULAR | Status: DC | PRN
  Administered 2016-01-02: 1 mg via INTRAVENOUS
  Administered 2016-01-02: 2 mg via INTRAVENOUS

## 2016-01-02 MED ORDER — MIDAZOLAM HCL 5 MG/ML IJ SOLN
INTRAMUSCULAR | Status: AC
  Filled 2016-01-02: qty 2

## 2016-01-02 MED ORDER — FENTANYL CITRATE (PF) 100 MCG/2ML IJ SOLN
INTRAMUSCULAR | Status: AC
  Filled 2016-01-02: qty 2

## 2016-01-02 NOTE — Telephone Encounter (Signed)
REMINDER IN EPIC °

## 2016-01-02 NOTE — H&P (View-Only) (Signed)
Primary Care Physician:  Robert Bellow, MD Primary Gastroenterologist:  Dr. Oneida Alar   Chief Complaint  Patient presents with  . Weight Loss  . Hemorrhoids    HPI:   Cheyenne Morris is a 82 y.o. female presenting today at the request of Dr. Karie Kirks secondary to IDA and heme positive stool. Outside labs reviewed from April 2017 with Hgb 10.6, ferritin 7, iron 23. Hgb 11.6 in Nov 2016. Last colonoscopy by Dr. Arnoldo Morale in 2013 with tubular adenoma.   Sometimes dark stool, sometimes "normal". No iron. Sometimes bright red blood per rectum. No abdominal pain. No N/V. Was taking aspirin but stopped 2-3 weeks. Baseline weight 150-160, now 139. Appetite is good, no changes. No early satiety. Legs feel tired sometimes.   Past Medical History  Diagnosis Date  . Diabetes mellitus   . Hypertension     Past Surgical History  Procedure Laterality Date  . Colonoscopy    . Cataract extraction, bilateral    . Colonoscopy  01/05/2012    Dr Jenkins:Sessile polyp in the ascending colon/Normal colonoscopy otherwise, path tubular adenoma    Current Outpatient Prescriptions  Medication Sig Dispense Refill  . aspirin EC 81 MG tablet Take 81 mg by mouth every morning.      . Calcium Carbonate-Vitamin D (CALCIUM-VITAMIN D) 600-200 MG-UNIT CAPS Take 1 tablet by mouth every morning.     Marland Kitchen glimepiride (AMARYL) 2 MG tablet Take 2 mg by mouth daily before breakfast.      . latanoprost (XALATAN) 0.005 % ophthalmic solution Place 1 drop into both eyes at bedtime.     Marland Kitchen lisinopril (PRINIVIL,ZESTRIL) 20 MG tablet Take 20 mg by mouth daily.      . polyethylene glycol (MIRALAX / GLYCOLAX) packet Take 17 g by mouth daily. Taking only as needed    . potassium chloride SA (K-DUR,KLOR-CON) 20 MEQ tablet Take 20 mEq by mouth daily. Takes 1/2 tablet daily    . simvastatin (ZOCOR) 40 MG tablet Take 40 mg by mouth at bedtime.       No current facility-administered medications for this visit.    Allergies as of  12/04/2015  . (No Known Allergies)    Family History  Problem Relation Age of Onset  . Diabetes Son   . Colon cancer Neg Hx     Social History   Social History  . Marital Status: Married    Spouse Name: N/A  . Number of Children: N/A  . Years of Education: N/A   Occupational History  . Not on file.   Social History Main Topics  . Smoking status: Never Smoker   . Smokeless tobacco: Not on file     Comment: Never smoked  . Alcohol Use: No  . Drug Use: No  . Sexual Activity: Not on file   Other Topics Concern  . Not on file   Social History Narrative    Review of Systems: Gen: see HPI  CV: Denies chest pain, heart palpitations, peripheral edema, syncope.  Resp: Denies shortness of breath at rest or with exertion. Denies wheezing or cough.  GI: see HPI  GU : Denies urinary burning, urinary frequency, urinary hesitancy MS: "tired in legs"  Derm: Denies rash, itching, dry skin Psych: Denies depression, anxiety, memory loss, and confusion Heme: Denies bruising, bleeding, and enlarged lymph nodes.  Physical Exam: BP 143/76 mmHg  Pulse 79  Temp(Src) 98.4 F (36.9 C) (Oral)  Ht 5\' 2"  (1.575 m)  Wt 139 lb 6.4 oz (  63.231 kg)  BMI 25.49 kg/m2 General:   Alert and oriented. Pleasant and cooperative. Well-nourished and well-developed.  Head:  Normocephalic and atraumatic. Eyes:  Without icterus, sclera clear and conjunctiva pink.  Ears:  Normal auditory acuity. Nose:  No deformity, discharge,  or lesions. Mouth:  No deformity or lesions, oral mucosa pink.  Lungs:  Clear to auscultation bilaterally. No wheezes, rales, or rhonchi. No distress.  Heart:  S1, S2 present without murmurs appreciated.  Abdomen:  +BS, soft, non-tender and non-distended. No HSM noted. No guarding or rebound. No masses appreciated.  Rectal:  Deferred  Msk:  Symmetrical without gross deformities. Normal posture. Extremities:  Without  edema. Neurologic:  Alert and  oriented x4;  grossly normal  neurologically. Psych:  Alert and cooperative. Normal mood and affect.

## 2016-01-02 NOTE — Op Note (Signed)
[facility] Patient Name: Cheyenne Morris Procedure Date: 01/02/2016 MRN: [id number] Attending MD: [name] , MD Date of Birth: 1931-07-05 CSN: [id number] Age: 82 Admit Type: Outpatient Procedure:                Lower EUS Indications:              Pre-treatment staging for rectal adenocarcinoma,                            diagnosed on recent colonoscopy Dr. Oneida Alar. No sign                            of distant metastatic disease on CT scan abd/pelvis. Providers:                [name], MD, Hilma Favors, RN, Ralene Bathe, Technician Referring MD:             Leighton Ruff, MD Medicines:                Fentanyl 25 micrograms IV, Midazolam 3 mg IV Complications:            No immediate complications. Estimated blood loss:                            None. Estimated Blood Loss:     Estimated blood loss: none. Procedure:                Pre-Anesthesia Assessment:                           - Prior to the procedure, a History and Physical                            was performed, and patient medications and                            allergies were reviewed. The patient's tolerance of                            previous anesthesia was also reviewed. The risks                            and benefits of the procedure and the sedation                            options and risks were discussed with the patient.                            All questions were answered, and informed consent                            was obtained. Prior Anticoagulants: The patient has  taken no previous anticoagulant or antiplatelet                            agents. ASA Grade Assessment: II - A patient with                            mild systemic disease. After reviewing the risks                            and benefits, the patient was deemed in                            satisfactory condition to undergo the procedure.                            After obtaining informed consent, the endoscope was                            passed under direct vision. Throughout the                            procedure, the patient's blood pressure, pulse, and                            oxygen saturations were monitored continuously. The                            [id number]) scope was introduced through                            the anus and advanced to the the sigmoid colon for                            ultrasound. The lower EUS was accomplished without                            difficulty. The patient tolerated the procedure                            well. The quality of the bowel preparation was good. Scope In: Scope Out: Findings:      Endosonographic Finding :      1. A hypoechoic mass was found in the rectum. The distal edge was 7cm       from the anal verge. The mass was partially circumferential (involving       90% of the lumen). The endosonographic borders were poorly-defined. The       mass measured 6 cm (in maximum length) by 10 mm (in maximum thickness).       There was sonographic evidence suggesting breakthrough of the muscularis       propria with invasion into the perirectal fat (Layer 5, manifested by       prominent pseudopodia). (uT3)      2. No perirectal adenopathy. Impression:               - 6cm  long, nearly circumferential uT3N0 rectal                            adenocarcinoma with distal edge located 7cm from                            the anal verge. The proximal and distal edges of                            the mass were previously labeled with injection of                            [place] Ink.                           - She will likely benefit from neoadjuvant                            chemo/XRT. Moderate Sedation:      Moderate (conscious) sedation was administered by the endoscopy nurse       and supervised by the endoscopist. The following parameters were       monitored: oxygen  saturation, heart rate, blood pressure, and response       to care. Total physician intraservice time was 10 minutes. Recommendation:           - Patient has a contact number available for                            emergencies. The signs and symptoms of potential                            delayed complications were discussed with the                            patient. Return to normal activities tomorrow.                            Written discharge instructions were provided to the                            patient.                           - Resume previous diet.                           - Continue present medications. Procedure Code(s):        --- Professional ---                           [phone], Sigmoidoscopy, flexible; with endoscopic                            ultrasound examination  [id number], Moderate sedation services provided by the                            same physician or other qualified health care                            professional performing the diagnostic or                            therapeutic service that the sedation supports,                            requiring the presence of an independent trained                            observer to assist in the monitoring of the                            patient's level of consciousness and physiological                            status; initial 15 minutes of intraservice time,                            patient age 29 years or older Diagnosis Code(s):        --- Professional ---                           C20, Malignant neoplasm of rectum                           C21.8, Malignant neoplasm of overlapping sites of                            rectum, anus and anal canal CPT copyright 2016 [employer]. All rights reserved. The codes documented in this report are preliminary and upon coder review may  be revised to meet current compliance requirements. [name],  MD 01/02/2016 1:14:36 PM This report has been signed electronically. Number of Addenda: 0

## 2016-01-02 NOTE — Telephone Encounter (Addendum)
PT HAD RECTAL U/S. PT HAS ONCOLOGY APPT TO DISCUSS NEO-ADJUVANT CHEMO/XRT-JUN 8 AT 320 PM WITH DR. Whitney Muse.

## 2016-01-02 NOTE — Discharge Instructions (Signed)

## 2016-01-02 NOTE — Interval H&P Note (Signed)
History and Physical Interval Note:  01/02/2016 12:33 PM  Cheyenne Morris  has presented today for surgery, with the diagnosis of rectal cancer  The various methods of treatment have been discussed with the patient and family. After consideration of risks, benefits and other options for treatment, the patient has consented to  Procedure(s): LOWER ENDOSCOPIC ULTRASOUND (EUS) (N/A) as a surgical intervention .  The patient's history has been reviewed, patient examined, no change in status, stable for surgery.  I have reviewed the patient's chart and labs.  Questions were answered to the patient's satisfaction.     [name]

## 2016-01-03 ENCOUNTER — Encounter (HOSPITAL_COMMUNITY): Payer: Self-pay | Admitting: Gastroenterology

## 2016-01-09 ENCOUNTER — Encounter (HOSPITAL_COMMUNITY): Payer: Medicare Other

## 2016-01-09 ENCOUNTER — Encounter (HOSPITAL_COMMUNITY): Payer: Medicare Other | Attending: Hematology & Oncology | Admitting: Hematology & Oncology

## 2016-01-09 ENCOUNTER — Encounter (HOSPITAL_COMMUNITY): Payer: Self-pay | Admitting: Hematology & Oncology

## 2016-01-09 VITALS — BP 175/82 | HR 79 | Temp 98.1°F | Resp 18 | Ht 62.5 in | Wt 140.8 lb

## 2016-01-09 DIAGNOSIS — R634 Abnormal weight loss: Secondary | ICD-10-CM

## 2016-01-09 DIAGNOSIS — Z5111 Encounter for antineoplastic chemotherapy: Secondary | ICD-10-CM | POA: Diagnosis not present

## 2016-01-09 DIAGNOSIS — Z510 Encounter for antineoplastic radiation therapy: Secondary | ICD-10-CM | POA: Insufficient documentation

## 2016-01-09 DIAGNOSIS — K625 Hemorrhage of anus and rectum: Secondary | ICD-10-CM

## 2016-01-09 DIAGNOSIS — D509 Iron deficiency anemia, unspecified: Secondary | ICD-10-CM

## 2016-01-09 DIAGNOSIS — C20 Malignant neoplasm of rectum: Secondary | ICD-10-CM | POA: Diagnosis present

## 2016-01-09 DIAGNOSIS — Z9889 Other specified postprocedural states: Secondary | ICD-10-CM | POA: Insufficient documentation

## 2016-01-09 DIAGNOSIS — E1169 Type 2 diabetes mellitus with other specified complication: Secondary | ICD-10-CM | POA: Diagnosis not present

## 2016-01-09 DIAGNOSIS — D649 Anemia, unspecified: Secondary | ICD-10-CM

## 2016-01-09 DIAGNOSIS — N183 Chronic kidney disease, stage 3 (moderate): Secondary | ICD-10-CM | POA: Insufficient documentation

## 2016-01-09 DIAGNOSIS — R970 Elevated carcinoembryonic antigen [CEA]: Secondary | ICD-10-CM | POA: Diagnosis not present

## 2016-01-09 DIAGNOSIS — I129 Hypertensive chronic kidney disease with stage 1 through stage 4 chronic kidney disease, or unspecified chronic kidney disease: Secondary | ICD-10-CM | POA: Diagnosis not present

## 2016-01-09 LAB — CBC WITH DIFFERENTIAL/PLATELET
Basophils Absolute: 0 10*3/uL (ref 0.0–0.1)
Basophils Relative: 1 %
EOS ABS: 0.1 10*3/uL (ref 0.0–0.7)
EOS PCT: 2 %
HCT: 34.4 % — ABNORMAL LOW (ref 36.0–46.0)
Hemoglobin: 10.9 g/dL — ABNORMAL LOW (ref 12.0–15.0)
LYMPHS ABS: 2.1 10*3/uL (ref 0.7–4.0)
Lymphocytes Relative: 33 %
MCH: 27.5 pg (ref 26.0–34.0)
MCHC: 31.7 g/dL (ref 30.0–36.0)
MCV: 86.6 fL (ref 78.0–100.0)
MONOS PCT: 10 %
Monocytes Absolute: 0.6 10*3/uL (ref 0.1–1.0)
Neutro Abs: 3.5 10*3/uL (ref 1.7–7.7)
Neutrophils Relative %: 54 %
PLATELETS: 260 10*3/uL (ref 150–400)
RBC: 3.97 MIL/uL (ref 3.87–5.11)
RDW: 14.8 % (ref 11.5–15.5)
WBC: 6.4 10*3/uL (ref 4.0–10.5)

## 2016-01-09 LAB — COMPREHENSIVE METABOLIC PANEL
ALK PHOS: 55 U/L (ref 38–126)
ALT: 13 U/L — ABNORMAL LOW (ref 14–54)
ANION GAP: 7 (ref 5–15)
AST: 21 U/L (ref 15–41)
Albumin: 4.2 g/dL (ref 3.5–5.0)
BUN: 21 mg/dL — ABNORMAL HIGH (ref 6–20)
CALCIUM: 10 mg/dL (ref 8.9–10.3)
CO2: 26 mmol/L (ref 22–32)
CREATININE: 1.07 mg/dL — AB (ref 0.44–1.00)
Chloride: 103 mmol/L (ref 101–111)
GFR, EST AFRICAN AMERICAN: 54 mL/min — AB (ref >60)
GFR, EST NON AFRICAN AMERICAN: 47 mL/min — AB (ref >60)
Glucose, Bld: 92 mg/dL (ref 65–99)
Potassium: 4.3 mmol/L (ref 3.5–5.1)
SODIUM: 136 mmol/L (ref 135–145)
TOTAL PROTEIN: 7.9 g/dL (ref 6.5–8.1)
Total Bilirubin: 0.6 mg/dL (ref 0.3–1.2)

## 2016-01-09 LAB — IRON AND TIBC
Iron: 24 ug/dL — ABNORMAL LOW (ref 28–170)
Saturation Ratios: 6 % — ABNORMAL LOW (ref 10.4–31.8)
TIBC: 424 ug/dL (ref 250–450)
UIBC: 400 ug/dL

## 2016-01-09 LAB — FOLATE: Folate: 18.1 ng/mL (ref >5.9)

## 2016-01-09 LAB — VITAMIN B12: Vitamin B-12: 406 pg/mL (ref 180–914)

## 2016-01-09 LAB — FERRITIN: FERRITIN: 6 ng/mL — AB (ref 11–307)

## 2016-01-09 NOTE — Patient Instructions (Addendum)
[place] at [facility] Discharge Instructions  RECOMMENDATIONS MADE BY THE CONSULTANT AND ANY TEST RESULTS WILL BE SENT TO YOUR REFERRING PHYSICIAN.  We will get a copy of Dr. Marlowe Aschoff note from [place].  We are referring you to [facility] for Radiation Consultation.   We would like for you to come to [facility] next Wed from 11-12:30 for chemotherapy teaching. This is done in the [facility]. You need to come to the Main Entrance and let the receptionist at the Information Desk know that you need to go to the [facility] for teaching. They will point you in the right direction.  We are contacting the Interventional Radiology Department at [facility] for port placement.   We will set you up to see Dr. Whitney Muse with your first chemo treatment.   The name of the drug you will be receiving is 5FU.  We need you to go to the Main Entrance desk and let them know that you are there to have labs drawn. I have contacted lab. They know you are coming.    Thank you for choosing [place] at [facility] to provide your oncology and hematology care.  To afford each patient quality time with our provider, please arrive at least 15 minutes before your scheduled appointment time.   Beginning January 23rd 2017 lab work for the [facility] will be done in the  Main lab at [facility] on 1st floor. If you have a lab appointment with the [facility] please come in thru the  Main Entrance and check in at the main information desk  You need to re-schedule your appointment should you arrive 10 or more minutes late.  We strive to give you quality time with our providers, and arriving late affects you and other patients whose appointments are after yours.  Also, if you no show three or more times for appointments you may be dismissed from the clinic at the providers discretion.     Again, thank you for choosing [facility].  Our hope  is that these requests will decrease the amount of time that you wait before being seen by our physicians.       _____________________________________________________________  Should you have questions after your visit to [facility], please contact our office at [phone] between the hours of 8:30 a.m. and 4:30 p.m.  Voicemails left after 4:30 p.m. will not be returned until the following business day.  For prescription refill requests, have your pharmacy contact our office.         Resources For Cancer Patients and their Caregivers ? American Cancer Society: Can assist with transportation, wigs, general needs, runs Look Good Feel Better.        808 455 2634 ? Cancer Care: Provides financial assistance, online support groups, medication/co-pay assistance.  1-800-813-HOPE (919) 469-3059) ? [place] Assists [place] cancer patients and their families through emotional , educational and financial support.  205 266 7515 ? [place] DSS Where to apply for food stamps, Medicaid and utility assistance. (938)064-6156 ? [employer]: Transportation to medical appointments. (743)238-6787 ? Social Security Administration: May apply for disability if have a Stage IV cancer. 815-559-4407 (936)858-0052 ? [employer], Disability and Transit Services: Assists with nutrition, care and transit needs. [place] Support Programs: @10RELATIVEDAYS @ > Cancer Support Group  2nd Tuesday of the month 1pm-2pm, Journey Room  > Creative Journey  3rd Tuesday of  the month 1130am-1pm, Journey Room  > Look Good Feel Better  1st Wednesday of the month 10am-12 noon, Journey Room (Call American Cancer Society to register 907-103-9845)  Implanted Port Insertion An implanted port is a central line that has a round shape and is placed under the skin. It is used as a long-term IV access for:   Medicines, such as chemotherapy.   Fluids.   Liquid  nutrition, such as total parenteral nutrition (TPN).   Blood samples.  LET [facility] CARE PROVIDER KNOW ABOUT:  Allergies to food or medicine.   Medicines taken, including vitamins, herbs, eye drops, creams, and over-the-counter medicines.   Any allergies to heparin.  Use of steroids (by mouth or creams).   Previous problems with anesthetics or numbing medicines.   History of bleeding problems or blood clots.   Previous surgery.   Other health problems, including diabetes and kidney problems.   Possibility of pregnancy, if this applies. RISKS AND COMPLICATIONS Generally, this is a safe procedure. However, as with any procedure, problems can occur. Possible problems include:  Damage to the blood vessel, bruising, or bleeding at the puncture site.   Infection.  Blood clot in the vessel that the port is in.  Breakdown of the skin over your port.  Very rarely a person may develop a condition called a pneumothorax, a collection of air in the chest that may cause one of the lungs to collapse. The placement of these catheters with the appropriate imaging guidance significantly decreases the risk of a pneumothorax.  BEFORE THE PROCEDURE   Your health care provider may want you to have blood tests. These tests can help tell how well your kidneys and liver are working. They can also show how well your blood clots.   If you take blood thinners (anticoagulant medicines), ask your health care provider when you should stop taking them.   Make arrangements for someone to drive you home. This is necessary if you have been sedated for your procedure.  PROCEDURE  Port insertion usually takes about 30-45 minutes.   An IV needle will be inserted in your arm. Medicine for pain and medicine to help relax you (sedative) will flow directly into your body through this needle.   You will lie on an exam table, and you will be connected to monitors to keep track of your heart rate,  blood pressure, and breathing throughout the procedure.  An oxygen monitoring device may be attached to your finger. Oxygen will be given.   Everything will be kept as germ free (sterile) as possible during the procedure. The skin near the point of the incision will be cleansed with antiseptic, and the area will be draped with sterile towels. The skin and deeper tissues over the port area will be made numb with a local anesthetic.  Two small cuts (incisions) will be made in the skin to insert the port. One will be made in the neck to obtain access to the vein where the catheter will lie.   Because the port reservoir will be placed under the skin, a small skin incision will be made in the upper chest, and a small pocket for the port will be made under the skin. The catheter that will be connected to the port tunnels to a large central vein in the chest. A small, raised area will remain on your body at the site of the reservoir when the procedure is complete.  The port placement will be done under imaging  guidance to ensure the proper placement.  The reservoir has a silicone covering that can be punctured with a special needle.   The port will be flushed with normal saline, and blood will be drawn to make sure it is working properly.  There will be nothing remaining outside the skin when the procedure is finished.   Incisions will be held together by stitches, surgical glue, or a special tape. AFTER THE PROCEDURE  You will stay in a recovery area until the anesthesia has worn off. Your blood pressure and pulse will be checked.  A final chest X-ray will be taken to check the placement of the port and to ensure that there is no injury to your lung.   This information is not intended to replace advice given to you by your health care provider. Make sure you discuss any questions you have with your health care provider.   Document Released: 05/10/2013 Document Revised: 08/10/2014 Document  Reviewed: 05/10/2013 Elsevier Interactive Patient Education 2016 Elsevier Inc. Fluorouracil, 5-FU injection What is this medicine? FLUOROURACIL, 5-FU (flure oh YOOR a sil) is a chemotherapy drug. It slows the growth of cancer cells. This medicine is used to treat many types of cancer like breast cancer, colon or rectal cancer, pancreatic cancer, and stomach cancer. This medicine may be used for other purposes; ask your health care provider or pharmacist if you have questions. What should I tell my health care provider before I take this medicine? They need to know if you have any of these conditions: -blood disorders -dihydropyrimidine dehydrogenase (DPD) deficiency -infection (especially a virus infection such as chickenpox, cold sores, or herpes) -kidney disease -liver disease -malnourished, poor nutrition -recent or ongoing radiation therapy -an unusual or allergic reaction to fluorouracil, other chemotherapy, other medicines, foods, dyes, or preservatives -pregnant or trying to get pregnant -breast-feeding How should I use this medicine? This drug is given as an infusion or injection into a vein. It is administered in a hospital or clinic by a specially trained health care professional. Talk to your pediatrician regarding the use of this medicine in children. Special care may be needed. Overdosage: If you think you have taken too much of this medicine contact a poison control center or emergency room at once. NOTE: This medicine is only for you. Do not share this medicine with others. What if I miss a dose? It is important not to miss your dose. Call your doctor or health care professional if you are unable to keep an appointment. What may interact with this medicine? -allopurinol -cimetidine -dapsone -digoxin -hydroxyurea -leucovorin -levamisole -medicines for seizures like ethotoin, fosphenytoin, phenytoin -medicines to increase blood counts like filgrastim, pegfilgrastim,  sargramostim -medicines that treat or prevent blood clots like warfarin, enoxaparin, and dalteparin -methotrexate -metronidazole -pyrimethamine -some other chemotherapy drugs like busulfan, cisplatin, estramustine, vinblastine -trimethoprim -trimetrexate -vaccines Talk to your doctor or health care professional before taking any of these medicines: -acetaminophen -aspirin -ibuprofen -ketoprofen -naproxen This list may not describe all possible interactions. Give your health care provider a list of all the medicines, herbs, non-prescription drugs, or dietary supplements you use. Also tell them if you smoke, drink alcohol, or use illegal drugs. Some items may interact with your medicine. What should I watch for while using this medicine? Visit your doctor for checks on your progress. This drug may make you feel generally unwell. This is not uncommon, as chemotherapy can affect healthy cells as well as cancer cells. Report any side effects. Continue your course of  treatment even though you feel ill unless your doctor tells you to stop. In some cases, you may be given additional medicines to help with side effects. Follow all directions for their use. Call your doctor or health care professional for advice if you get a fever, chills or sore throat, or other symptoms of a cold or flu. Do not treat yourself. This drug decreases your body's ability to fight infections. Try to avoid being around people who are sick. This medicine may increase your risk to bruise or bleed. Call your doctor or health care professional if you notice any unusual bleeding. Be careful brushing and flossing your teeth or using a toothpick because you may get an infection or bleed more easily. If you have any dental work done, tell your dentist you are receiving this medicine. Avoid taking products that contain aspirin, acetaminophen, ibuprofen, naproxen, or ketoprofen unless instructed by your doctor. These medicines may hide  a fever. Do not become pregnant while taking this medicine. Women should inform their doctor if they wish to become pregnant or think they might be pregnant. There is a potential for serious side effects to an unborn child. Talk to your health care professional or pharmacist for more information. Do not breast-feed an infant while taking this medicine. Men should inform their doctor if they wish to father a child. This medicine may lower sperm counts. Do not treat diarrhea with over the counter products. Contact your doctor if you have diarrhea that lasts more than 2 days or if it is severe and watery. This medicine can make you more sensitive to the sun. Keep out of the sun. If you cannot avoid being in the sun, wear protective clothing and use sunscreen. Do not use sun lamps or tanning beds/booths. What side effects may I notice from receiving this medicine? Side effects that you should report to your doctor or health care professional as soon as possible: -allergic reactions like skin rash, itching or hives, swelling of the face, lips, or tongue -low blood counts - this medicine may decrease the number of white blood cells, red blood cells and platelets. You may be at increased risk for infections and bleeding. -signs of infection - fever or chills, cough, sore throat, pain or difficulty passing urine -signs of decreased platelets or bleeding - bruising, pinpoint red spots on the skin, black, tarry stools, blood in the urine -signs of decreased red blood cells - unusually weak or tired, fainting spells, lightheadedness -breathing problems -changes in vision -chest pain -mouth sores -nausea and vomiting -pain, swelling, redness at site where injected -pain, tingling, numbness in the hands or feet -redness, swelling, or sores on hands or feet -stomach pain -unusual bleeding Side effects that usually do not require medical attention (report to your doctor or health care professional if they  continue or are bothersome): -changes in finger or toe nails -diarrhea -dry or itchy skin -hair loss -headache -loss of appetite -sensitivity of eyes to the light -stomach upset -unusually teary eyes This list may not describe all possible side effects. Call your doctor for medical advice about side effects. You may report side effects to FDA at 1-800-FDA-1088. Where should I keep my medicine? This drug is given in a hospital or clinic and will not be stored at home. NOTE: This sheet is a summary. It may not cover all possible information. If you have questions about this medicine, talk to your doctor, pharmacist, or health care provider.    2016, Elsevier/Gold Standard. (  2007-11-23 13:53:16) External Beam Radiation Therapy External beam radiation therapy is a radiation treatment. It may be done to:  Treat cancer. The radiation may be used to:  Destroy cancer cells. Radiation delivered during the treatment damages cancer cells. It also damages normal cells, but normal cells have the DNA to repair themselves while cancer cells do not.  Help with symptoms of your cancer.  Stop the growth of any remaining cancer cells after surgery.  Prevent cancer cells from growing in areas that do not have evidence of cancer (prophylactic radiation therapy).  Treat or shrink a tumor.  Reduce pain (palliative therapy). The therapy delivers higher doses of radiation than X-rays, CT scans, and most other imaging tests. Compared with internal radiation therapy, external beam radiation therapy can deliver radiation to a fairly large area.  The amount of radiation you will receive and the length of therapy depends on your medical condition. You should not feel the radiation being delivered or any pain during your therapy. RISKS AND COMPLICATIONS Most people experience side effects from the therapy. Side effects depend on the amount of radiation and the part of your body exposed to radiation. For  example:  Hair loss may occur if the radiation therapy is directed to your head.  Coughing or difficulty swallowing may occur if the radiation therapy is directed to your head, neck, or chest.  Nausea, vomiting, or diarrhea may occur if the radiation therapy is directed to your abdomen or pelvis.  Bladder problems, frequent urination, or sexual dysfunction may occur if the radiation therapy is directed to your bladder, kidney, or prostate. Regardless of the amount or location of the radiation, you will probably have fatigue. Other side effects may include:  Red, flaking skin in the affected area.  Hair loss in the affected area.  Itching in the affected area. Side effects may take 2-3 weeks to develop. Most side effects are temporary and can be controlled. Once the therapy is complete, side effects will not stop right away. It can take up to 3-4 weeks for you to regain your energy or for side effects to lessen. Your body does heal from the radiation. BEFORE THE PROCEDURE There will be a planning session (simulation). During the session:  Your health care provider will plan exactly where the radiation will be delivered (treatment field).  You will be positioned for your therapy. The goal is to have a position that can be reproduced for each therapy session.  Temporary marks may be drawn on your body. Permanent marks may also be drawn on your body in order for you to be positioned the same way for each therapy session. PROCEDURE  You will either lie on a table or sit in a chair in the position determined for your therapy.  The radiation machine (linear accelerator) will move around you to deliver the radiation in exact doses from many angles. AFTER THE PROCEDURE You may return to your normal schedule including diet, activities, and medicines, unless your health care provider tells you otherwise.   This information is not intended to replace advice given to you by your health care  provider. Make sure you discuss any questions you have with your health care provider.   Document Released: 12/06/2008 Document Revised: 04/10/2015 Document Reviewed: 06/28/2013 Elsevier Interactive Patient Education 2016 [place]. External Beam Radiation Therapy, Care After Refer to this sheet in the next few weeks. These instructions provide you with information on caring for yourself after your treatments. Your health care provider  may also give you more specific instructions. Your treatment has been planned according to current medical practices, but problems sometimes occur. Call your health care provider if you have any problems or questions after your treatments. WHAT TO EXPECT AFTER THE PROCEDURE After external beam radiation therapy, it is typical to have the following:  Fatigue.  Red, flaking skin in the affected area.  Hair loss in the affected area.  Itching in the affected area. Depending upon which part of your body is exposed to radiation and how much radiation is used, other side effects may occur. For example:  Hair loss may occur if the radiation therapy is directed to your head.  Coughing or difficulty swallowing may occur if the radiation therapy is directed to your head, neck, or chest.  Nausea, vomiting, or diarrhea may occur if the radiation therapy is directed to your abdomen or pelvis.  Bladder problems, frequent urination, or sexual dysfunction may occur if the radiation therapy is directed to your bladder, kidney, or prostate. Most side effects are usually temporary and lessen over time. It can take up to 3-4 weeks for you to regain your energy or for side effects to lessen.  HOME CARE INSTRUCTIONS  Take medicines only as directed by your health care provider.  Do not use a heating pad or a warm cloth to relieve pain in your affected area.  Wash your skin with a mild soap as directed by your health care provider. Do not scrub or rub your skin. Pat  yourself dry.  Apply lotion or cream to your affected area as directed by your health care provider. Sunscreen does not protect you from the radiation burn and is not recommended.  Keep your affected area covered when outdoors.  Avoid scratching your affected area.  Follow your health care provider's advice for the type and amount of liquids to drink each day.  Maintain your weight during treatment.  Schedule and attend follow-up visits as directed by your health care provider. These are usually scheduled 6 weeks to 6 months after radiation therapy. They are needed to determine if the therapy helped reduce your pain or eliminate your cancer cells. It is important to keep all your appointments. SEEK MEDICAL CARE IF:  You have a fever.  You have increased pain in your affected area.  The redness worsens in your affected area.  Your affected skin develops open areas or blisters.  You have prolonged nausea or vomiting.  You have prolonged diarrhea.  You have an unexplained weight loss.   This information is not intended to replace advice given to you by your health care provider. Make sure you discuss any questions you have with your health care provider.   Document Released: 07/25/2013 Document Revised: 08/10/2014 Document Reviewed: 07/25/2013 Elsevier Interactive Patient Education 2016 [employer]. Chemotherapy Chemotherapy is the use of medicines to stop or slow the growth of cancer cells. Depending on the type and stage of your cancer, you may have chemotherapy to:  Cure your cancer.  Slow the progression of your cancer.  Ease your cancer symptoms.  Improve the benefits of radiation treatment.  Shrink a tumor before surgery.  Rid the body of cancer cells that remain after a tumor is surgically removed. HOW IS CHEMOTHERAPY GIVEN? Chemotherapy may be given:  By mouth in liquid or pill form.  Through a thin tube that is inserted into a vein or artery.  By getting  a shot.  By rubbing a cream or ointment on your  skin.  Through liquids that are placed directly into various areas of the body, such as the abdomen, chest, or bladder. HOW OFTEN IS CHEMOTHERAPY GIVEN? Chemotherapy may be given continuously over time, or it may be given in cycles. For example, you may take the medicine for one week out of every month. FOR HOW LONG WILL I NEED CHEMOTHERAPY TREATMENTS? The length of treatment depends on many factors, including:  The type of cancer.  Whether the cancer has spread.  How you respond to the chemotherapy.  Whether you develop side effects. Some types of chemotherapy medicine are given only one time. Others are given for months, years, or for life. WHAT SAFETY PRECAUTIONS MUST I TAKE WHILE ON CHEMOTHERAPY? Chemotherapy medicines are very strong. They will be in all of your bodily fluids, including your urine, stool, saliva, sweat, tears, vaginal secretions, and semen. You must carefully follow some safety precautions to prevent harm to others while you are using these medicines. Here are some recommended precautions:  Make sure that people who help care for you wear disposable gloves if they are going to come into contact with any of your bodily fluids. Women who are pregnant or breastfeeding should not handle any of your bodily fluids.  Wash any clothes, towels, and linens that may have your bodily fluids on them twice in a washing machine using very hot water.  Dispose of adult diapers, tampons, and sanitary napkins by first sealing them in a plastic bag.  Use a condom when having sex for at least 2 weeks after receiving your chemotherapy.  Do not share beverages or food.  Keep your chemotherapy medicines in their original bottles. Keep them in a high, safe location, away from children. Do not expose them to heat or moisture. Do not put them in containers with other types of medicines.  Dispose of all wrappers for your chemotherapy  medicines by sealing them in a separate plastic bag.  Do not throw away extra medicine, and do not flush it down the toilet. Take medicine that you are not going to use to your health care provider's office where it can be disposed of properly.  Follow your health care provider's directions for the proper disposal of needles, IV tubing, and other medical supplies that have come into contact with your chemotherapy medicines.  If you are issued a hazardous waste container, make sure you understand the directions for using it.  Wash your hands thoroughly with warm water and soap after using the bathroom. Dry your hands with disposable paper towels.  When using the toilet:  Flush it twice after each use, including after vomiting.  Close the lid of the toilet prior to flushing. This helps to avoid splashing.  Both men and women should sit to use the toilet. This helps avoid splashing. WHAT ARE THE SIDE EFFECTS OF CHEMOTHERAPY? Side effects depend on a variety of factors, including:  The specific type of chemotherapy medicine used.  The dosage.  How long the medicine is used for.  Your overall health. Some of the side effects you may experience include:  Fatigue and decreased energy.  Decreased appetite.  Changes in your sense of smell or taste.  Nausea.  Vomiting.  Constipation or diarrhea.  Hair loss.  Increased susceptibility to infection.  Easy bleeding.  Mouth sores.  Burning or tingling in the hands or feet.  Memory problems.   This information is not intended to replace advice given to you by your health care provider. Make sure  you discuss any questions you have with your health care provider.   Document Released: 05/17/2007 Document Revised: 08/10/2014 Document Reviewed: 12/26/2013 Elsevier Interactive Patient Education [employer].

## 2016-01-09 NOTE — Progress Notes (Signed)
[facility] NOTE  Patient Care Team: Lemmie Evens, MD as PCP - General (Family Medicine) Danie Binder, MD as Consulting Physician (Gastroenterology)  CHIEF COMPLAINTS/PURPOSE OF CONSULTATION:     Rectal cancer [facility])   12/20/2015 Tumor Marker CEA 10.3    12/20/2015 Pathology Results Biopsy c/w adenocarcinoma   12/20/2015 Procedure Colonoscopy with Dr. Oneida Alar with partially obstructing mass extending from proximal to mid rectum, 10 cm from anal verge   12/23/2015 Imaging CT abdomen/pelvis rectosigmoid wall thickening two 4 mm perirectal LN, No evidence metastatic disease   01/02/2016 Procedure EUS with Dr. Ardis Hughs nearly circumferential mass distal edge 7 cm from anal verge, No perirectal adenopathy [id number]    HISTORY OF PRESENTING ILLNESS:  Cheyenne Morris 82 y.o. female is here because of newly diagnosed adenocarcinoma of the rectum. She comes to the [facility] today accompanied by several family members, her husband Izell Ross, two daughters, and granddaughter. Cheyenne Morris appears very fit and young for her age.  She saw a little blood in her stool, starting about a year ago. She had also lost a little weight, saying "it just fell off." She had had a colonoscopy before, and went to see Dr. Oneida Alar for another when she noticed the blood in her stool. She was told to go get the colonoscopy after noticing the symptoms. She'd only ever had one polyp removed during a colonoscopy before, and had "8" removed during this most recent one.  After the colonoscopy, she was told that they found cancer. At that time, Cheyenne Morris had to go see Dr. Ardis Hughs in [place]. He did pre-treatment staging studies for her rectal cancer, and made the appointment to come here to [facility]. After this, she notes that she had to go down to [place]. She spoke to a lady there. She says that, in [place], "they just weighed me and talked to me about the cancer." She cannot remember the name of  the female doctor she spoke with.  After an extensive overview of her cancer and treatment trajectory, one of her daughters asks "are you okay?" and Cheyenne Morris notes "I'm okay." Her daughters were advised that they can come and attend other appointments to remain on top of everything.  Cheyenne Morris notes that she doesn't have any rectal pain at present. She notes that sometimes it will pain, especially if she's been sitting a long time, but not often.  Still regularly receives mammograms.  During the physical exam, she denies any chest pain, and denies shortness of breath. She also denies any abdominal pain. She notes that she is still having some bleeding in her stool, "sometimes, not all the time."  She says she has planted a garden this year, with tomatoes, cucumbers, and squash. She is still active and able to do what she wants without difficulty.  She notes she eats. At the end of her appointment, she says "I'm hungry!" and notes that she wants some potatoes and gravy. She then says "I'm not that hungry; I just wanted to laugh!" She notes that her mood is optimistic, she has a good support system.   She is interested in therapy. She is here today for further discussion and treatment recommendations.    MEDICAL HISTORY:  Past Medical History  Diagnosis Date  . Diabetes mellitus   . Hypertension   . Rectal cancer [facility])     SURGICAL HISTORY: Past Surgical History  Procedure Laterality Date  . Colonoscopy    . Cataract  extraction, bilateral    . Colonoscopy  01/05/2012    Dr Jenkins:Sessile polyp in the ascending colon/Normal colonoscopy otherwise, path tubular adenoma  . Colonoscopy N/A 12/20/2015    Procedure: COLONOSCOPY;  Surgeon: Danie Binder, MD;  [facility];  Service: Endoscopy;  Laterality: N/A;  0930  . Esophagogastroduodenoscopy N/A 12/20/2015    Procedure: ESOPHAGOGASTRODUODENOSCOPY (EGD);  Surgeon: Danie Binder, MD;  Location: [facility];  Service:  Endoscopy;  Laterality: N/A;  . Eus N/A 01/02/2016    Procedure: LOWER ENDOSCOPIC ULTRASOUND (EUS);  Surgeon: [name], MD;  Location: [facility];  Service: Endoscopy;  Laterality: N/A;    SOCIAL HISTORY: Social History   Social History  . Marital Status: Married    Spouse Name: N/A  . Number of Children: N/A  . Years of Education: N/A   Occupational History  . Not on file.   Social History Main Topics  . Smoking status: Never Smoker   . Smokeless tobacco: Never Used     Comment: Never smoked  . Alcohol Use: No  . Drug Use: No  . Sexual Activity: No   Other Topics Concern  . Not on file   Social History Narrative   Married 52 years in September. 2 daughters and 2 sons. 5 grandchildren, and 3 great-grandchildren.  She was born in [place]. Worked outside the home, making trashcans. Her hobbies include shopping, gardening flowers & vegetables. Never smoker. Never drank a lot.  FAMILY HISTORY: Family History  Problem Relation Age of Onset  . Diabetes Son   . Colon cancer Neg Hx    Mother and father both lived a long time. Mother was 11 when she passed; father was older too. She notes they both had good minds too. Mrs. Kaseman kept her mother when she hit her 65's.  1 sister still living; healthy aside from knee trouble.  ALLERGIES:  is allergic to lime.  MEDICATIONS:  Current Outpatient Prescriptions  Medication Sig Dispense Refill  . Calcium Carbonate-Vitamin D (CALCIUM-VITAMIN D) 600-200 MG-UNIT CAPS Take 1 tablet by mouth every morning.     Marland Kitchen glimepiride (AMARYL) 2 MG tablet Take 2 mg by mouth daily before breakfast.      . latanoprost (XALATAN) 0.005 % ophthalmic solution Place 1 drop into both eyes at bedtime.     Marland Kitchen lisinopril (PRINIVIL,ZESTRIL) 20 MG tablet Take 20 mg by mouth daily.      . polyethylene glycol (MIRALAX / GLYCOLAX) packet Take 17 g by mouth daily. Reported on 01/13/2016    . potassium chloride SA (K-DUR,KLOR-CON) 20 MEQ tablet  Take 20 mEq by mouth daily. Takes 1/2 tablet daily    . simvastatin (ZOCOR) 40 MG tablet     . triamterene-hydrochlorothiazide (MAXZIDE) 75-50 MG tablet     . fluorouracil CALGB 16109 in sodium chloride 0.9 % 150 mL Inject into the vein. NOTE: this medication hasn't started yet    . iron polysaccharides (NIFEREX) 150 MG capsule Take 1 capsule (150 mg total) by mouth daily. 30 capsule 2  . ondansetron (ZOFRAN) 8 MG tablet Take 1 tablet (8 mg total) by mouth every 8 (eight) hours as needed for nausea or vomiting. (Patient not taking: Reported on 01/13/2016) 30 tablet 2  . prochlorperazine (COMPAZINE) 10 MG tablet Take 1 tablet (10 mg total) by mouth every 6 (six) hours as needed for nausea or vomiting. (Patient not taking: Reported on 01/13/2016) 30 tablet 2   No current facility-administered medications for this visit.  Review of Systems  Constitutional: Positive for weight loss. Negative for fever, chills, malaise/fatigue and diaphoresis.  HENT: Negative.  Negative for congestion, ear discharge, ear pain, hearing loss, nosebleeds, sore throat and tinnitus.   Eyes: Negative.  Negative for blurred vision, double vision, photophobia, pain, discharge and redness.  Respiratory: Negative.  Negative for cough, hemoptysis, sputum production, shortness of breath, wheezing and stridor.   Cardiovascular: Negative.  Negative for chest pain, palpitations, orthopnea, claudication, leg swelling and PND.  Gastrointestinal: Positive for blood in stool. Negative for heartburn, nausea, vomiting, abdominal pain, diarrhea, constipation and melena.  Genitourinary: Negative.  Negative for dysuria, urgency, frequency, hematuria and flank pain.  Musculoskeletal: Negative.  Negative for myalgias, back pain, joint pain, falls and neck pain.  Skin: Negative.  Negative for itching and rash.  Neurological: Negative.  Negative for dizziness, tingling, tremors, sensory change, speech change, focal weakness, seizures, loss of  consciousness, weakness and headaches.  Endo/Heme/Allergies: Negative.   Psychiatric/Behavioral: Negative.  Negative for depression, suicidal ideas, hallucinations, memory loss and substance abuse. The patient is not nervous/anxious and does not have insomnia.   All other systems reviewed and are negative.  14 point ROS was done and is otherwise as detailed above or in HPI   PHYSICAL EXAMINATION: ECOG PERFORMANCE STATUS: 0 - Asymptomatic  Filed Vitals:   01/09/16 1452  BP: 175/82  Pulse: 79  Temp: 98.1 F (36.7 C)  Resp: 18   Filed Weights   01/09/16 1452  Weight: 140 lb 12.8 oz (63.866 kg)     Physical Exam  Constitutional: She is oriented to person, place, and time and well-developed, well-nourished, and in no distress.  Easily able to ambulate to the exam table.  HENT:  Head: Normocephalic and atraumatic.  Nose: Nose normal.  Mouth/Throat: Oropharynx is clear and moist. No oropharyngeal exudate.  Eyes: Conjunctivae and EOM are normal. Pupils are equal, round, and reactive to light. Right eye exhibits no discharge. Left eye exhibits no discharge. No scleral icterus.  Neck: Normal range of motion. Neck supple. No tracheal deviation present. No thyromegaly present.  Cardiovascular: Normal rate, regular rhythm, normal heart sounds and intact distal pulses.  Exam reveals no gallop and no friction rub.   No murmur heard. Pulmonary/Chest: Effort normal and breath sounds normal. She has no wheezes. She has no rales.  Abdominal: Soft. Bowel sounds are normal. She exhibits no distension and no mass. There is no tenderness. There is no rebound and no guarding.  Genitourinary:  Rectal exam deferred  Musculoskeletal: Normal range of motion. She exhibits no edema.  Lymphadenopathy:    She has no cervical adenopathy.  Neurological: She is alert and oriented to person, place, and time. She has normal reflexes. No cranial nerve deficit. Gait normal. Coordination normal.  Skin: Skin is  warm and dry. No rash noted.  Psychiatric: Mood, memory, affect and judgment normal.  Nursing note and vitals reviewed.    LABORATORY DATA:  I have reviewed the data as listed Lab Results  Component Value Date   WBC 6.4 01/09/2016   HGB 10.9* 01/09/2016   HCT 34.4* 01/09/2016   MCV 86.6 01/09/2016   PLT 260 01/09/2016   CMP     Component Value Date/Time   NA 136 01/09/2016 1620   K 4.3 01/09/2016 1620   CL 103 01/09/2016 1620   CO2 26 01/09/2016 1620   GLUCOSE 92 01/09/2016 1620   BUN 21* 01/09/2016 1620   CREATININE 1.07* 01/09/2016 1620   CALCIUM 10.0 01/09/2016  1620   PROT 7.9 01/09/2016 1620   ALBUMIN 4.2 01/09/2016 1620   AST 21 01/09/2016 1620   ALT 13* 01/09/2016 1620   ALKPHOS 55 01/09/2016 1620   BILITOT 0.6 01/09/2016 1620   GFRNONAA 47* 01/09/2016 1620   GFRAA 54* 01/09/2016 1620   Results for Cheyenne Morris, Cheyenne Morris (MRN [id number]) as of 01/14/2016 07:32  Ref. Range 12/20/2015 11:31  CEA Latest Ref Range: 0.0-4.7 ng/mL 10.3 (H)    RADIOGRAPHIC STUDIES: I have personally reviewed the radiological images as listed and agreed with the findings in the report.   Addendum     Lorin Picket, MD  [facility] Dec 23, 2015 4:46:03 PM EDT     ADDENDUM REPORT: 12/23/2015 16:43 CONTRAST: 100 cc Isovue-300. Electronically Signed  By: Lorin Picket M.D.  On: 12/23/2015 16:43     Study Result     CLINICAL DATA: Rectal mass seen on colonoscopy.  EXAM: CT ABDOMEN AND PELVIS WITH CONTRAST  TECHNIQUE: Multidetector CT imaging of the abdomen and pelvis was performed using the standard protocol following bolus administration of intravenous contrast.  COMPARISON: None.  FINDINGS: Lower chest: Lung bases show no acute findings. Heart is at the upper limits of normal in size. Coronary artery calcification. No pericardial or pleural effusion.  Hepatobiliary: The liver and gallbladder are unremarkable. No biliary ductal dilatation.  Pancreas:  Negative.  Spleen: Negative.  Adrenals/Urinary Tract: Adrenal glands are unremarkable. Sub cm low-attenuation lesions in the kidneys are too small to characterize but statistically, cysts are most likely. Ureters are decompressed. Bladder is unremarkable.  Stomach/Bowel: Stomach, small bowel, appendix and majority of the colon are unremarkable. There is focal thickening of the rectosigmoid colon (series 2, image 65).  Vascular/Lymphatic: Atherosclerotic calcification of the arterial vasculature without abdominal aortic aneurysm. 4 mm left perirectal lymph nodes (series 2, images 61 and 66). Otherwise, no pathologically enlarged lymph nodes.  Reproductive: Uterus and ovaries are visualized.  Other: No free fluid. Mesenteries and peritoneum are unremarkable.  Musculoskeletal: No worrisome lytic or sclerotic lesions. Degenerative changes are seen in the spine.  IMPRESSION: 1. Rectosigmoid wall thickening likely corresponds to findings on recent colonoscopy. Two 4 mm perirectal lymph nodes may be metastatic. No evidence of distant metastatic disease. 2. Coronary artery calcification.  Electronically Signed: By: Lorin Picket M.D. On: 12/23/2015 16:35    ASSESSMENT & PLAN:  [id number] adenocarcinoma of the rectum Rectal bleeding Anemia Elevated CEA Mild Weight loss  We reviewed the NCCN guidelines for rectal carcinoma. We discussed staging in detail. We reviewed her evaluation thus far. I do believe she would be a good candidate for neoadjuvant chemoradiation, surgery and adjuvant XELODA. We reviewed this in detail today.  I prefer infusional 5-FU during radiation as I feel its toxicity is more predictable and we discussed port placement. I reviewed picture of port-a -caths and discussed the rationale behind port placement.  The patient is very motivated to proceed with therapy.  She is uncertain who she saw in [place], but I believe she spoke with Dr. Barry Dienes. We  will request a copy of her office visit. The family has requested that if possible additional surgical visits are in [place] as the daughters live in [place].   The patient is interested in pursuing radiation at [facility], we will make the referral today.   She has had CT imaging of the abdomen/pelvis, with no evidence of metastatic disease.  She is anemic and we will work up her anemia as detailed below.  She will need chemotherapy teaching  and this will be arranged as well. Chemotherapy orders are placed for pre-authorization. Will anticipate start date on D1 of XRT, this was discussed with the patient and family as well.   ORDERS PLACED FOR THIS ENCOUNTER: Orders Placed This Encounter  Procedures  . IR Fluoro Guide CV Line Left  . CBC with Differential  . CEA  . Comprehensive metabolic panel  . Ferritin  . Vitamin B12  . Folate  . Iron and TIBC   All questions were answered. The patient knows to call the clinic with any problems, questions or concerns.  This document serves as a record of services personally performed by Ancil Linsey, MD. It was created on her behalf by Toni Amend, a trained medical scribe. The creation of this record is based on the scribe's personal observations and the provider's statements to them. This document has been checked and approved by the attending provider.  I have reviewed the above documentation for accuracy and completeness and I agree with the above.  This note was electronically signed.    Molli Hazard, MD  01/14/2016 7:33 AM

## 2016-01-11 LAB — CEA: CEA: 10.3 ng/mL — AB (ref 0.0–4.7)

## 2016-01-11 MED ORDER — PROCHLORPERAZINE MALEATE 10 MG PO TABS: 10.0000 mg | ORAL_TABLET | Freq: Four times a day (QID) | ORAL | Status: AC | PRN

## 2016-01-11 MED ORDER — ONDANSETRON HCL 8 MG PO TABS: 8.0000 mg | ORAL_TABLET | Freq: Three times a day (TID) | ORAL | Status: AC | PRN

## 2016-01-11 NOTE — Patient Instructions (Signed)
[place]   CHEMOTHERAPY INSTRUCTIONS  We are giving you infusional 5FU (fluorouracil) for the treatment of your rectal cancer.   Chemo:  5FU: bone marrow suppression (low white blood cells - wbcs fight infection, low red blood cells - rbcs make up your blood, low platelets - this is what makes your blood clot, nausea/vomiting, diarrhea, mouth sores/thrush, hair loss, dry skin, ocular toxicities (increased tear production, sensitivity to light). You must wear sunscreen/sunglasses. Cover your skin when out in sunlight. Use a SPF of 50. You will get burned very easily.   The nurse will connect you to an ambulatory pump with this medication attached. You will wear the pump throughout the duration of your radiation. You will come once a week to the [facility] to have the needle removed from your port a cath, the port area cleaned, a new needle inserted into your port and a new bag of 5FU connected to your pump. We will change your batteries weekly but will also provide you with batteries in case your pump beeps "low battery". You will get further instruction on the ambulatory pump when you are here the first day for treatment.   We change the needle once a week to decrease the risk of infection.    (Please refer to/review other teaching materials that have been provided to you in this blue folder - What to Know During Chemo, What to know After Chemo, Dr. Donald Pore Advice, Constipation Sheet, Diarrhea Sheet, Nausea Sheet, Self Care Activities While on Chemo)   POTENTIAL SIDE EFFECTS OF TREATMENT: Increased Susceptibility to Infection, Vomiting, Hair Thinning, Changes in Character of Skin and Nails (brittleness, dryness,etc.), Pigment Changes (darkening of nail beds, palms of hands, soles of feet, etc.), Bone Marrow Suppression, Nausea, Diarrhea, Sun Sensitivity and Mouth Sores    MEDICATIONS: You have been given prescriptions for the following  medications:  Zofran/Ondansetron 8mg  tablet. Take 1 tablet every 8 hours as needed for nausea/vomiting. (#1 nausea med to take, this can constipate)  Compazine/Prochlorperazine 10mg  tablet. Take 1 tablet every 6 hours as needed for nausea/vomiting. (#2 nausea med to take, this can make you sleepy)    SYMPTOMS TO REPORT AS SOON AS POSSIBLE AFTER TREATMENT:  FEVER GREATER THAN 100.5 F  CHILLS WITH OR WITHOUT FEVER  NAUSEA AND VOMITING THAT IS NOT CONTROLLED WITH YOUR NAUSEA MEDICATION  UNUSUAL SHORTNESS OF BREATH  UNUSUAL BRUISING OR BLEEDING  TENDERNESS IN MOUTH AND THROAT WITH OR WITHOUT PRESENCE OF ULCERS  URINARY PROBLEMS  BOWEL PROBLEMS  UNUSUAL RASH    Wear comfortable clothing and clothing appropriate for easy access to any Portacath or PICC line. Let us know if there is anything that we can do to make your therapy better!      I have been informed and understand all of the instructions given to me and have received a copy. I have been instructed to call the clinic [phone] or my family physician as soon as possible for continued medical care, if indicated. I do not have any more questions at this time but understand that I may call the [facility] or the Patient Navigator at [phone] during office hours should I have questions or need assistance in obtaining follow-up care.

## 2016-01-13 ENCOUNTER — Encounter: Payer: Self-pay | Admitting: Radiation Oncology

## 2016-01-13 ENCOUNTER — Other Ambulatory Visit (HOSPITAL_COMMUNITY): Payer: Self-pay

## 2016-01-13 ENCOUNTER — Ambulatory Visit
Admission: RE | Admit: 2016-01-13 | Discharge: 2016-01-13 | Disposition: A | Discharge status: Home / Self Care | Payer: Medicare Other | Source: Ambulatory Visit | Attending: Radiation Oncology | Admitting: Radiation Oncology

## 2016-01-13 ENCOUNTER — Encounter (HOSPITAL_COMMUNITY): Payer: Self-pay | Admitting: Hematology & Oncology

## 2016-01-13 ENCOUNTER — Ambulatory Visit (HOSPITAL_COMMUNITY): Payer: Medicare Other | Admitting: Hematology & Oncology

## 2016-01-13 VITALS — BP 170/68 | HR 80 | Resp 16 | Ht 63.0 in | Wt 139.6 lb

## 2016-01-13 DIAGNOSIS — C20 Malignant neoplasm of rectum: Secondary | ICD-10-CM | POA: Insufficient documentation

## 2016-01-13 DIAGNOSIS — Z510 Encounter for antineoplastic radiation therapy: Secondary | ICD-10-CM | POA: Insufficient documentation

## 2016-01-13 DIAGNOSIS — D509 Iron deficiency anemia, unspecified: Secondary | ICD-10-CM

## 2016-01-13 HISTORY — DX: Malignant neoplasm of rectum: C20

## 2016-01-13 MED ORDER — POLYSACCHARIDE IRON COMPLEX 150 MG PO CAPS: 150.0000 mg | ORAL_CAPSULE | Freq: Every day | ORAL | Status: AC

## 2016-01-13 NOTE — Progress Notes (Signed)
[place]         [phone] ________________________________  Initial Outpatient Consultation  Name: Cheyenne Morris MRN: [id number]  Date: 01/13/2016  DOB: 1930/11/11  REFERRING PHYSICIAN: Patrici Ranks, MD  DIAGNOSIS: 82 y.o. woman with stage uT3N0 adenocarcinoma of the rectum - Stage IIA    ICD-9-CM ICD-10-CM   1. Rectal cancer ([place]) 154.1 C20     HISTORY OF PRESENT ILLNESS::Cheyenne Morris is a 82 y.o. female who was initially seen by gastroenterology in June 2015 for hematochezia in the setting of constipation and hemorrhoids. A colonoscopy earlier in June 2013 by Dr. Arnoldo Morale noted a sessile polyp (tubular adenoma) in the ascending colon, but the colonoscopy was otherwise normal.  On 12/04/15, the patient was referred by Dr. Karie Kirks back to gastroenterology secondary to iron deficiency anemia, heme positive stool, and a 20 lb weight loss. A colonoscopy was performed on 12/20/15. This found a 8 cm mass in the proximal/ rectum, 2 polyps in the descending colon, a polyp in the ascending colon, 3 sessile polyps in the sigmoid colon, 2 sessile polyps in the cecum, and possible perianal condylomata. The polyps were tubular adenomas, but the 8 cm mass was positive for invasive moderately differentiated colorectal adenocarcinoma.  The patient had a CT scan of the abd/pelvis with contrast on 12/23/15. This revealed rectosigmoid wall thickening corresponding to findings on her colonoscopy. Also of note were two 4 mm perirectal lymph nodes that may be metastatic. There was no evidence of distant metastatic disease. On 01/02/16, the patient had a lower endoscopic ultrasound. This found a 6 x 1 cm hypoechoic mass (uT3N0 rectal adenocarcinoma) with the distal edge 7 cm from the anal verge in the rectum and no perirectal adenopathy. There was sonographic evidence suggesting breakthrough of the muscularis propria with invasion in the perirectal fat. Dr. Ardis Hughs recommended  neoadjuvant chemotherapy with radiation.  On 01/09/16, the patient presented to Dr. Whitney Muse who discussed the patient's diagnosis in more detail. She recommended neoadjuvant chemotherapy and radiation. After surgery, the patient would be placed on Xeloda for four months. The patient presents today to discuss the role of radiation in the management of her disease.  PREVIOUS RADIATION THERAPY: No  Past Medical History  Diagnosis Date  . Diabetes mellitus   . Hypertension   . Rectal cancer [facility])   :   Past Surgical History  Procedure Laterality Date  . Colonoscopy    . Cataract extraction, bilateral    . Colonoscopy  01/05/2012    Dr Jenkins:Sessile polyp in the ascending colon/Normal colonoscopy otherwise, path tubular adenoma  . Colonoscopy N/A 12/20/2015    Procedure: COLONOSCOPY;  Surgeon: Danie Binder, MD;  [facility];  Service: Endoscopy;  Laterality: N/A;  0930  . Esophagogastroduodenoscopy N/A 12/20/2015    Procedure: ESOPHAGOGASTRODUODENOSCOPY (EGD);  Surgeon: Danie Binder, MD;  [facility];  Service: Endoscopy;  Laterality: N/A;  . Eus N/A 01/02/2016    Procedure: LOWER ENDOSCOPIC ULTRASOUND (EUS);  Surgeon: [name], MD;  Location: [facility];  Service: Endoscopy;  Laterality: N/A;    Current outpatient prescriptions:  .  Calcium Carbonate-Vitamin D (CALCIUM-VITAMIN D) 600-200 MG-UNIT CAPS, Take 1 tablet by mouth every morning. , Disp: , Rfl:  .  fluorouracil CALGB 29562 in sodium chloride 0.9 % 150 mL, Inject into the vein. NOTE: this medication hasn't started yet, Disp: , Rfl:  .  glimepiride (AMARYL) 2 MG tablet, Take 2 mg by mouth daily before  breakfast.  , Disp: , Rfl:  .  latanoprost (XALATAN) 0.005 % ophthalmic solution, Place 1 drop into both eyes at bedtime. , Disp: , Rfl:  .  lisinopril (PRINIVIL,ZESTRIL) 20 MG tablet, Take 20 mg by mouth daily.  , Disp: , Rfl:  .  potassium chloride SA (K-DUR,KLOR-CON) 20 MEQ tablet, Take 20 mEq by  mouth daily. Takes 1/2 tablet daily, Disp: , Rfl:  .  simvastatin (ZOCOR) 40 MG tablet, , Disp: , Rfl:  .  triamterene-hydrochlorothiazide (MAXZIDE) 75-50 MG tablet, , Disp: , Rfl:  .  iron polysaccharides (NIFEREX) 150 MG capsule, Take 1 capsule (150 mg total) by mouth daily., Disp: 30 capsule, Rfl: 2 .  ondansetron (ZOFRAN) 8 MG tablet, Take 1 tablet (8 mg total) by mouth every 8 (eight) hours as needed for nausea or vomiting. (Patient not taking: Reported on 01/13/2016), Disp: 30 tablet, Rfl: 2 .  polyethylene glycol (MIRALAX / GLYCOLAX) packet, Take 17 g by mouth daily. Reported on 01/13/2016, Disp: , Rfl:  .  prochlorperazine (COMPAZINE) 10 MG tablet, Take 1 tablet (10 mg total) by mouth every 6 (six) hours as needed for nausea or vomiting. (Patient not taking: Reported on 01/13/2016), Disp: 30 tablet, Rfl: 2:   Allergies  Allergen Reactions  . Lime [Calcium Oxysulfide] Swelling    Lips swell  :   Family History  Problem Relation Age of Onset  . Diabetes Son   . Colon cancer Neg Hx   :   Social History   Social History  . Marital Status: Married    Spouse Name: N/A  . Number of Children: N/A  . Years of Education: N/A   Occupational History  . Not on file.   Social History Main Topics  . Smoking status: Never Smoker   . Smokeless tobacco: Never Used     Comment: Never smoked  . Alcohol Use: No  . Drug Use: No  . Sexual Activity: No   Other Topics Concern  . Not on file   Social History Narrative  :  REVIEW OF SYSTEMS:  A 15 point review of systems is documented in the electronic medical record. This was obtained by the nursing staff. However, I reviewed this with the patient to discuss relevant findings and make appropriate changes.  Pertinent items are noted in HPI.  The patient reports occasional blood in her stools. She reports 1-2 regular formed bowel movements a day without pain. She has rare instances of diarrhea that is treated with Miralax. She reports one  occasion where she had brief rectal pain after sitting on the stool for an extended period of time.  PHYSICAL EXAM:  Blood pressure 170/68, pulse 80, resp. rate 16, height 5\' 3"  (1.6 m), weight 139 lb 9.6 oz (63.322 kg), SpO2 100 %. Alert and oriented x3. In no acute distress.  DRE tumor palpable at 8-10 cm  KPS = 90  100 - Normal; no complaints; no evidence of disease. 90   - Able to carry on normal activity; minor signs or symptoms of disease. 80   - Normal activity with effort; some signs or symptoms of disease. 40   - Cares for self; unable to carry on normal activity or to do active work. 60   - Requires occasional assistance, but is able to care for most of his personal needs. 50   - Requires considerable assistance and frequent medical care. 29   - Disabled; requires special care and assistance. 30   - Severely disabled;  hospital admission is indicated although death not imminent. 45   - Very sick; hospital admission necessary; active supportive treatment necessary. 10   - Moribund; fatal processes progressing rapidly. 0     - Dead  Karnofsky DA, Abelmann Butler, Craver LS and Burchenal  [facility] [phone]) The use of the nitrogen mustards in the palliative treatment of carcinoma: with particular reference to bronchogenic carcinoma Cancer 1 [phone]  LABORATORY DATA:  Lab Results  Component Value Date   WBC 6.4 01/09/2016   HGB 10.9* 01/09/2016   HCT 34.4* 01/09/2016   MCV 86.6 01/09/2016   PLT 260 01/09/2016   Lab Results  Component Value Date   NA 136 01/09/2016   K 4.3 01/09/2016   CL 103 01/09/2016   CO2 26 01/09/2016   Lab Results  Component Value Date   ALT 13* 01/09/2016   AST 21 01/09/2016   ALKPHOS 55 01/09/2016   BILITOT 0.6 01/09/2016     RADIOGRAPHY: Ct Abdomen Pelvis W Contrast  12/23/2015  ADDENDUM REPORT: 12/23/2015 16:43 CONTRAST:  100 cc Isovue-300. Electronically Signed   By: Lorin Picket M.D.   On: 12/23/2015 16:43  12/23/2015  CLINICAL DATA:  Rectal mass  seen on colonoscopy. EXAM: CT ABDOMEN AND PELVIS WITH CONTRAST TECHNIQUE: Multidetector CT imaging of the abdomen and pelvis was performed using the standard protocol following bolus administration of intravenous contrast. COMPARISON:  None. FINDINGS: Lower chest: Lung bases show no acute findings. Heart is at the upper limits of normal in size. Coronary artery calcification. No pericardial or pleural effusion. Hepatobiliary: The liver and gallbladder are unremarkable. No biliary ductal dilatation. Pancreas: Negative. Spleen: Negative. Adrenals/Urinary Tract: Adrenal glands are unremarkable. Sub cm low-attenuation lesions in the kidneys are too small to characterize but statistically, cysts are most likely. Ureters are decompressed. Bladder is unremarkable. Stomach/Bowel: Stomach, small bowel, appendix and majority of the colon are unremarkable. There is focal thickening of the rectosigmoid colon (series 2, image 65). Vascular/Lymphatic: Atherosclerotic calcification of the arterial vasculature without abdominal aortic aneurysm. 4 mm left perirectal lymph nodes (series 2, images 61 and 66). Otherwise, no pathologically enlarged lymph nodes. Reproductive: Uterus and ovaries are visualized. Other: No free fluid.  Mesenteries and peritoneum are unremarkable. Musculoskeletal: No worrisome lytic or sclerotic lesions. Degenerative changes are seen in the spine. IMPRESSION: 1. Rectosigmoid wall thickening likely corresponds to findings on recent colonoscopy. Two 4 mm perirectal lymph nodes may be metastatic. No evidence of distant metastatic disease. 2. Coronary artery calcification. Electronically Signed: By: Lorin Picket M.D. On: 12/23/2015 16:35   IMPRESSION: 82 y.o. woman with stage uT3N0 adenocarcinoma of the rectum - Stage IIA  The patient is a good candidate for neoadjuvant chemotherapy and radiation. I anticipate 25 fractions of radiation to the rectum with the patient to have surgery 4-8 weeks after the  completion of radiation.  PLAN: Today, I talked to the patient and family about the findings and work-up thus far.  We discussed the natural history of stage IIA rectal cancer and general treatment, highlighting the role of radiotherapy in the management.  We discussed the available radiation techniques, and focused on the details of logistics and delivery.  We reviewed the anticipated acute and late sequelae associated with radiation in this setting.  The patient was encouraged to ask questions that I answered to the best of my ability.  I filled out a patient counseling form during our discussion; including treatment diagrams.  We retained a copy for our records.  The patient would like  to proceed with radiation and is scheduled for CT simulation tomorrow at 11AM.  The patient is tentatively scheduled to have a port-a-cath placed on 01/16/16 and begin chemoradiotherapy on 01/20/16.   ------------------------------------------------  Sheral Apley. Tammi Klippel, M.D.  This document serves as a record of services personally performed by Tyler Pita, MD. It was created on his behalf by Darcus Austin, a trained medical scribe. The creation of this record is based on the scribe's personal observations and the provider's statements to them. This document has been checked and approved by the attending provider.

## 2016-01-13 NOTE — Progress Notes (Signed)
GI Location of Tumor / Histology: invasive moderately differentiated colorectal adenocarcinoma  Cheyenne Morris reports she saw a little blood in her stool, starting one year ago    Past/Anticipated interventions by surgeon, if any: biopsy with intentions for more surgery following radiation and chemotherapy  Past/Anticipated interventions by medical oncology, if any: before she has surgery, recommendations are to do chemotherapy (5-FU) and radiation first to shrink the tumor. After surgical excision, we will place her on the chemotherapy pill Xeloda for four months. Patient scheduled for chemo teaching at [facility] on 01/15/16 but, reports she wants to receive both chemo and radiation here at [facility] long.  Weight changes, if any: yes, 15 lb x 1 month  Bowel/Bladder complaints, if any: blood in stool sometimes but, not all the time. Reports regular formed bowel movements 1 - 2 per day without pain. Reports on rare occasions is uses Miralax for constipation but, denies having used any recently.  Nausea / Vomiting, if any: no  Pain issues, if any:  No. Report there was one occasion where she sat on a stool for an extended period and had brief rectal pain thereafter.  Any blood per rectum:   yes  SAFETY ISSUES:  Prior radiation? no  Pacemaker/ICD? no  Possible current pregnancy? no  Is the patient on methotrexate? no  Current Complaints/Details: 82 year old female. Married for 52 years. 2 daughters and 2 sons. 5 grandchildren and 3 great grandchildren. Never a smoker. Enjoys shopping and gardening. Patient scheduled for port placement on Thursday.

## 2016-01-13 NOTE — Progress Notes (Signed)
See progress note under physician encounter. 

## 2016-01-14 ENCOUNTER — Ambulatory Visit
Admission: RE | Admit: 2016-01-14 | Discharge: 2016-01-14 | Disposition: A | Discharge status: Home / Self Care | Payer: Medicare Other | Source: Ambulatory Visit | Attending: Radiation Oncology | Admitting: Radiation Oncology

## 2016-01-14 DIAGNOSIS — C20 Malignant neoplasm of rectum: Secondary | ICD-10-CM

## 2016-01-14 DIAGNOSIS — Z510 Encounter for antineoplastic radiation therapy: Secondary | ICD-10-CM | POA: Diagnosis not present

## 2016-01-14 MED ORDER — LIDOCAINE-PRILOCAINE 2.5-2.5 % EX CREA: TOPICAL_CREAM | CUTANEOUS | Status: AC

## 2016-01-14 NOTE — Addendum Note (Signed)
Encounter addended by: Tyler Pita, MD on: 01/14/2016  3:41 PM<BR>     Documentation filed: Follow-up Section, LOS Section

## 2016-01-14 NOTE — Progress Notes (Signed)
  Radiation Oncology         [phone] ________________________________  Name: ANNELIES HELGERSON MRN: [id number]  Date: 01/14/2016  DOB: 09/29/1930  SIMULATION AND TREATMENT PLANNING NOTE    ICD-9-CM ICD-10-CM   1. Rectal cancer ([facility]) 154.1 C20     DIAGNOSIS:  82 y.o. woman with stage uT3N0 adenocarcinoma of the rectum - Stage IIA  NARRATIVE:  The patient was brought to the [place].  Identity was confirmed.  All relevant records and images related to the planned course of therapy were reviewed.  The patient freely provided informed written consent to proceed with treatment after reviewing the details related to the planned course of therapy. The consent form was witnessed and verified by the simulation staff.  Then, the patient was set-up in a stable reproducible  prone and belly board position for radiation therapy.  CT images were obtained.  Surface markings were placed.  The CT images were loaded into the planning software.  Then the target and avoidance structures were contoured.  Treatment planning then occurred.  The radiation prescription was entered and confirmed.  Then, I designed and supervised the construction of a total of 4 medically necessary complex treatment devices MLCs to shield critical structures.  I have requested : 3D Simulation  I have requested a DVH of the following structures: bladder, left hip, right hip, target.  I have ordered:Nutrition Consult and CBC  Optical Surface Tracking Plan:  Since 3D conformal radiation treatment methods are predicated on accurate and precise positioning for treatment, intrafraction motion monitoring is medically necessary to ensure accurate and safe treatment delivery.  The ability to quantify intrafraction motion without excessive ionizing radiation dose can only be performed with optical surface tracking. Accordingly, surface imaging offers the opportunity to obtain 3D measurements of patient position throughout IMRT and  3D treatments without excessive radiation exposure.  I am ordering optical surface tracking for this patient's upcoming course of radiotherapy.  PLAN:  The patient will receive 45 Gy in 25 fraction pre-op starting on 6/19  ________________________________  Sheral Apley. Tammi Klippel, M.D.

## 2016-01-15 ENCOUNTER — Other Ambulatory Visit: Payer: Self-pay | Admitting: General Surgery

## 2016-01-15 ENCOUNTER — Encounter (HOSPITAL_COMMUNITY): Payer: Medicare Other

## 2016-01-15 DIAGNOSIS — C20 Malignant neoplasm of rectum: Secondary | ICD-10-CM

## 2016-01-15 NOTE — Progress Notes (Signed)
Chemo teaching done by chemo teaching nurse today in [place]. Consent to be signed at first day of chemo.

## 2016-01-16 ENCOUNTER — Ambulatory Visit (HOSPITAL_COMMUNITY)
Admission: RE | Admit: 2016-01-16 | Discharge: 2016-01-16 | Disposition: A | Discharge status: Home / Self Care | Payer: Medicare Other | Source: Ambulatory Visit | Attending: Hematology & Oncology | Admitting: Hematology & Oncology

## 2016-01-16 ENCOUNTER — Other Ambulatory Visit (HOSPITAL_COMMUNITY): Payer: Self-pay | Admitting: Hematology & Oncology

## 2016-01-16 ENCOUNTER — Encounter (HOSPITAL_COMMUNITY): Payer: Self-pay

## 2016-01-16 DIAGNOSIS — I10 Essential (primary) hypertension: Secondary | ICD-10-CM | POA: Diagnosis not present

## 2016-01-16 DIAGNOSIS — D509 Iron deficiency anemia, unspecified: Secondary | ICD-10-CM

## 2016-01-16 DIAGNOSIS — C20 Malignant neoplasm of rectum: Secondary | ICD-10-CM | POA: Insufficient documentation

## 2016-01-16 DIAGNOSIS — E119 Type 2 diabetes mellitus without complications: Secondary | ICD-10-CM | POA: Insufficient documentation

## 2016-01-16 LAB — GLUCOSE, CAPILLARY: Glucose-Capillary: 110 mg/dL — ABNORMAL HIGH (ref 65–99)

## 2016-01-16 LAB — CBC
HCT: 32.7 % — ABNORMAL LOW (ref 36.0–46.0)
Hemoglobin: 10.4 g/dL — ABNORMAL LOW (ref 12.0–15.0)
MCH: 27.3 pg (ref 26.0–34.0)
MCHC: 31.8 g/dL (ref 30.0–36.0)
MCV: 85.8 fL (ref 78.0–100.0)
Platelets: 254 10*3/uL (ref 150–400)
RBC: 3.81 MIL/uL — ABNORMAL LOW (ref 3.87–5.11)
RDW: 15.2 % (ref 11.5–15.5)
WBC: 5.5 10*3/uL (ref 4.0–10.5)

## 2016-01-16 LAB — APTT: APTT: 30 s (ref 24–37)

## 2016-01-16 LAB — PROTIME-INR
INR: 1 (ref 0.00–1.49)
PROTHROMBIN TIME: 13 s (ref 11.6–15.2)

## 2016-01-16 MED ORDER — HEPARIN SOD (PORK) LOCK FLUSH 100 UNIT/ML IV SOLN
INTRAVENOUS | Status: AC
  Filled 2016-01-16: qty 5

## 2016-01-16 MED ORDER — LIDOCAINE HCL 1 % IJ SOLN
INTRAMUSCULAR | Status: DC
Start: 2016-01-16 — End: 2016-01-17
  Filled 2016-01-16: qty 20

## 2016-01-16 MED ORDER — SODIUM CHLORIDE 0.9 % IV SOLN
INTRAVENOUS | Status: DC
  Administered 2016-01-16: 13:00:00 via INTRAVENOUS

## 2016-01-16 MED ORDER — CEFAZOLIN SODIUM-DEXTROSE 2-4 GM/100ML-% IV SOLN
2.0000 g | INTRAVENOUS | Status: AC
  Administered 2016-01-16: 2 g via INTRAVENOUS
  Filled 2016-01-16: qty 100

## 2016-01-16 MED ORDER — MIDAZOLAM HCL 2 MG/2ML IJ SOLN
INTRAMUSCULAR | Status: AC | PRN
  Administered 2016-01-16 (×3): 1 mg via INTRAVENOUS

## 2016-01-16 MED ORDER — FENTANYL CITRATE (PF) 100 MCG/2ML IJ SOLN
INTRAMUSCULAR | Status: AC | PRN
  Administered 2016-01-16: 50 ug via INTRAVENOUS

## 2016-01-16 MED ORDER — MIDAZOLAM HCL 2 MG/2ML IJ SOLN
INTRAMUSCULAR | Status: AC
  Filled 2016-01-16: qty 6

## 2016-01-16 MED ORDER — FENTANYL CITRATE (PF) 100 MCG/2ML IJ SOLN
INTRAMUSCULAR | Status: AC
  Filled 2016-01-16: qty 4

## 2016-01-16 MED ORDER — LIDOCAINE HCL 1 % IJ SOLN
INTRAMUSCULAR | Status: AC | PRN
  Administered 2016-01-16: 20 mL

## 2016-01-16 MED ORDER — HEPARIN SOD (PORK) LOCK FLUSH 100 UNIT/ML IV SOLN
INTRAVENOUS | Status: AC | PRN
  Administered 2016-01-16: 500 [IU]

## 2016-01-16 NOTE — H&P (Signed)
Chief Complaint: Patient was seen in consultation today for port placement at the request of Penland,Shannon K  Referring Physician(s): Ancil Linsey K  Supervising Physician: Arne Cleveland  Patient Status: Outpatient  History of Present Illness: Cheyenne Morris is a 82 y.o. female with rectal cancer. She is to undergo chemo and radiation therapy. She is referred for port placement. PMHx, meds, labs, imaging, allergies reviewed. Has been NPO this am. Husband at bedside.  Past Medical History  Diagnosis Date  . Diabetes mellitus   . Hypertension   . Rectal cancer [facility])     Past Surgical History  Procedure Laterality Date  . Colonoscopy    . Cataract extraction, bilateral    . Colonoscopy  01/05/2012    Dr Jenkins:Sessile polyp in the ascending colon/Normal colonoscopy otherwise, path tubular adenoma  . Colonoscopy N/A 12/20/2015    Procedure: COLONOSCOPY;  Surgeon: Danie Binder, MD;  [facility];  Service: Endoscopy;  Laterality: N/A;  0930  . Esophagogastroduodenoscopy N/A 12/20/2015    Procedure: ESOPHAGOGASTRODUODENOSCOPY (EGD);  Surgeon: Danie Binder, MD;  [facility];  Service: Endoscopy;  Laterality: N/A;  . Eus N/A 01/02/2016    Procedure: LOWER ENDOSCOPIC ULTRASOUND (EUS);  Surgeon: [name], MD;  Location: [facility];  Service: Endoscopy;  Laterality: N/A;    Allergies: Lime  Medications: Prior to Admission medications   Medication Sig Start Date End Date Taking? Authorizing Provider  Calcium Carbonate-Vitamin D (CALCIUM-VITAMIN D) 600-200 MG-UNIT CAPS Take 1 tablet by mouth every morning.    Yes Historical Provider, MD  glimepiride (AMARYL) 2 MG tablet Take 2 mg by mouth daily before breakfast.     Yes Historical Provider, MD  iron polysaccharides (NIFEREX) 150 MG capsule Take 1 capsule (150 mg total) by mouth daily. 01/13/16  Yes Patrici Ranks, MD  latanoprost (XALATAN) 0.005 % ophthalmic solution Place 1 drop into  both eyes at bedtime.    Yes Historical Provider, MD  lisinopril (PRINIVIL,ZESTRIL) 20 MG tablet Take 20 mg by mouth daily.     Yes Historical Provider, MD  potassium chloride SA (K-DUR,KLOR-CON) 20 MEQ tablet Take 20 mEq by mouth daily. Takes 1/2 tablet daily   Yes Historical Provider, MD  simvastatin (ZOCOR) 40 MG tablet  01/02/16  Yes Historical Provider, MD  triamterene-hydrochlorothiazide (MAXZIDE) 75-50 MG tablet  12/10/15  Yes Historical Provider, MD  fluorouracil CALGB 16109 in sodium chloride 0.9 % 150 mL Inject into the vein. NOTE: this medication hasn't started yet    Historical Provider, MD  lidocaine-prilocaine (EMLA) cream Apply a quarter size amount to port site 1 hour prior to chemo. Do not rub in. Cover with plastic wrap. 01/14/16   Patrici Ranks, MD  ondansetron (ZOFRAN) 8 MG tablet Take 1 tablet (8 mg total) by mouth every 8 (eight) hours as needed for nausea or vomiting. Patient not taking: Reported on 01/13/2016 01/11/16   Patrici Ranks, MD  polyethylene glycol (MIRALAX / GLYCOLAX) packet Take 17 g by mouth daily. Reported on 01/13/2016    Historical Provider, MD  prochlorperazine (COMPAZINE) 10 MG tablet Take 1 tablet (10 mg total) by mouth every 6 (six) hours as needed for nausea or vomiting. Patient not taking: Reported on 01/13/2016 01/11/16   Patrici Ranks, MD     Family History  Problem Relation Age of Onset  . Diabetes Son   . Colon cancer Neg Hx     Social History   Social History  .  Marital Status: Married    Spouse Name: N/A  . Number of Children: N/A  . Years of Education: N/A   Social History Main Topics  . Smoking status: Never Smoker   . Smokeless tobacco: Never Used     Comment: Never smoked  . Alcohol Use: No  . Drug Use: No  . Sexual Activity: No   Other Topics Concern  . None   Social History Narrative     Review of Systems: A 12 point ROS discussed and pertinent positives are indicated in the HPI above.  All other systems are  negative.  Review of Systems  Vital Signs: BP 163/75 mmHg  Pulse 82  Temp(Src) 98.2 F (36.8 C) (Oral)  Resp 20  Ht 5\' 2"  (1.575 m)  Wt 138 lb 6.4 oz (62.778 kg)  BMI 25.31 kg/m2  SpO2 100%  Physical Exam  Constitutional: She is oriented to person, place, and time. She appears well-developed and well-nourished. No distress.  HENT:  Head: Normocephalic.  Mouth/Throat: Oropharynx is clear and moist.  Neck: Normal range of motion. No tracheal deviation present.  Cardiovascular: Normal rate, regular rhythm and normal heart sounds.   Pulmonary/Chest: Effort normal and breath sounds normal. No respiratory distress.  Neurological: She is alert and oriented to person, place, and time.  Skin: Skin is warm and dry. No rash noted.  Psychiatric: She has a normal mood and affect. Judgment normal.    Mallampati Score:  MD Evaluation Airway: WNL Heart: WNL Abdomen: WNL Chest/ Lungs: WNL ASA  Classification: 2 Mallampati/Airway Score: One  Imaging: Ct Abdomen Pelvis W Contrast  12/23/2015  ADDENDUM REPORT: 12/23/2015 16:43 CONTRAST:  100 cc Isovue-300. Electronically Signed   By: Lorin Picket M.D.   On: 12/23/2015 16:43  12/23/2015  CLINICAL DATA:  Rectal mass seen on colonoscopy. EXAM: CT ABDOMEN AND PELVIS WITH CONTRAST TECHNIQUE: Multidetector CT imaging of the abdomen and pelvis was performed using the standard protocol following bolus administration of intravenous contrast. COMPARISON:  None. FINDINGS: Lower chest: Lung bases show no acute findings. Heart is at the upper limits of normal in size. Coronary artery calcification. No pericardial or pleural effusion. Hepatobiliary: The liver and gallbladder are unremarkable. No biliary ductal dilatation. Pancreas: Negative. Spleen: Negative. Adrenals/Urinary Tract: Adrenal glands are unremarkable. Sub cm low-attenuation lesions in the kidneys are too small to characterize but statistically, cysts are most likely. Ureters are decompressed.  Bladder is unremarkable. Stomach/Bowel: Stomach, small bowel, appendix and majority of the colon are unremarkable. There is focal thickening of the rectosigmoid colon (series 2, image 65). Vascular/Lymphatic: Atherosclerotic calcification of the arterial vasculature without abdominal aortic aneurysm. 4 mm left perirectal lymph nodes (series 2, images 61 and 66). Otherwise, no pathologically enlarged lymph nodes. Reproductive: Uterus and ovaries are visualized. Other: No free fluid.  Mesenteries and peritoneum are unremarkable. Musculoskeletal: No worrisome lytic or sclerotic lesions. Degenerative changes are seen in the spine. IMPRESSION: 1. Rectosigmoid wall thickening likely corresponds to findings on recent colonoscopy. Two 4 mm perirectal lymph nodes may be metastatic. No evidence of distant metastatic disease. 2. Coronary artery calcification. Electronically Signed: By: Lorin Picket M.D. On: 12/23/2015 16:35    Labs:  CBC:  Recent Labs  12/20/15 1131 01/09/16 1620 01/16/16 1225  WBC 3.7* 6.4 5.5  HGB 10.4* 10.9* 10.4*  HCT 32.9* 34.4* 32.7*  PLT 245 260 254    COAGS:  Recent Labs  12/20/15 1131 01/16/16 1225  INR 0.96 1.00  APTT  --  30  BMP:  Recent Labs  12/20/15 1131 01/09/16 1620  NA 139 136  K 3.5 4.3  CL 107 103  CO2 27 26  GLUCOSE 109* 92  BUN 11 21*  CALCIUM 9.0 10.0  CREATININE 0.94 1.07*  GFRNONAA 55* 47*  GFRAA >60 54*    LIVER FUNCTION TESTS:  Recent Labs  12/20/15 1131 01/09/16 1620  BILITOT 0.4 0.6  AST 19 21  ALT 10* 13*  ALKPHOS 47 55  PROT 7.1 7.9  ALBUMIN 3.7 4.2    TUMOR MARKERS:  Recent Labs  12/20/15 1131 01/09/16 1620  CEA 10.3* 10.3*    Assessment and Plan: Rectal cancer For Port placement Labs ok. Risks and Benefits discussed with the patient including, but not limited to bleeding, infection, pneumothorax, or fibrin sheath development and need for additional procedures. All of the patient's questions were  answered, patient is agreeable to proceed. Consent signed and in chart.   Thank you for this interesting consult.  I greatly enjoyed meeting [employer] and look forward to participating in their care.  A copy of this report was sent to the requesting provider on this date.  Electronically Signed: Ascencion Dike 01/16/2016, 12:51 PM   I spent a total of 20 minutes in face to face in clinical consultation, greater than 50% of which was counseling/coordinating care for port placement

## 2016-01-16 NOTE — Discharge Instructions (Signed)

## 2016-01-16 NOTE — Procedures (Signed)
R IJ Port cathter placement with US and fluoroscopy No complication No blood loss. See complete dictation in [facility] PACS.  

## 2016-01-19 DIAGNOSIS — Z510 Encounter for antineoplastic radiation therapy: Secondary | ICD-10-CM | POA: Diagnosis not present

## 2016-01-20 DIAGNOSIS — Z510 Encounter for antineoplastic radiation therapy: Secondary | ICD-10-CM | POA: Diagnosis not present

## 2016-01-21 ENCOUNTER — Ambulatory Visit
Admission: RE | Admit: 2016-01-21 | Discharge: 2016-01-21 | Disposition: A | Discharge status: Home / Self Care | Payer: Medicare Other | Source: Ambulatory Visit | Attending: Radiation Oncology | Admitting: Radiation Oncology

## 2016-01-21 ENCOUNTER — Encounter (HOSPITAL_BASED_OUTPATIENT_CLINIC_OR_DEPARTMENT_OTHER): Payer: Medicare Other

## 2016-01-21 ENCOUNTER — Encounter (HOSPITAL_BASED_OUTPATIENT_CLINIC_OR_DEPARTMENT_OTHER): Payer: Medicare Other | Admitting: Oncology

## 2016-01-21 ENCOUNTER — Encounter (HOSPITAL_COMMUNITY): Payer: Self-pay | Admitting: Oncology

## 2016-01-21 VITALS — BP 143/69 | HR 79 | Temp 98.2°F | Resp 20 | Wt 138.3 lb

## 2016-01-21 DIAGNOSIS — C20 Malignant neoplasm of rectum: Secondary | ICD-10-CM

## 2016-01-21 DIAGNOSIS — D509 Iron deficiency anemia, unspecified: Secondary | ICD-10-CM | POA: Diagnosis not present

## 2016-01-21 DIAGNOSIS — Z5111 Encounter for antineoplastic chemotherapy: Secondary | ICD-10-CM

## 2016-01-21 DIAGNOSIS — Z510 Encounter for antineoplastic radiation therapy: Secondary | ICD-10-CM | POA: Diagnosis not present

## 2016-01-21 DIAGNOSIS — D649 Anemia, unspecified: Secondary | ICD-10-CM | POA: Diagnosis not present

## 2016-01-21 DIAGNOSIS — N183 Chronic kidney disease, stage 3 (moderate): Secondary | ICD-10-CM

## 2016-01-21 LAB — CBC WITH DIFFERENTIAL/PLATELET
BASOS ABS: 0 10*3/uL (ref 0.0–0.1)
Basophils Relative: 0 %
Eosinophils Absolute: 0.1 10*3/uL (ref 0.0–0.7)
Eosinophils Relative: 2 %
HEMATOCRIT: 30.6 % — AB (ref 36.0–46.0)
HEMOGLOBIN: 9.7 g/dL — AB (ref 12.0–15.0)
LYMPHS PCT: 27 %
Lymphs Abs: 1.6 10*3/uL (ref 0.7–4.0)
MCH: 27.9 pg (ref 26.0–34.0)
MCHC: 31.7 g/dL (ref 30.0–36.0)
MCV: 87.9 fL (ref 78.0–100.0)
Monocytes Absolute: 0.6 10*3/uL (ref 0.1–1.0)
Monocytes Relative: 10 %
NEUTROS ABS: 3.7 10*3/uL (ref 1.7–7.7)
Neutrophils Relative %: 61 %
Platelets: 240 10*3/uL (ref 150–400)
RBC: 3.48 MIL/uL — AB (ref 3.87–5.11)
RDW: 15.8 % — ABNORMAL HIGH (ref 11.5–15.5)
WBC: 6 10*3/uL (ref 4.0–10.5)

## 2016-01-21 LAB — COMPREHENSIVE METABOLIC PANEL
ALT: 10 U/L — AB (ref 14–54)
AST: 17 U/L (ref 15–41)
Albumin: 3.9 g/dL (ref 3.5–5.0)
Alkaline Phosphatase: 49 U/L (ref 38–126)
Anion gap: 8 (ref 5–15)
BILIRUBIN TOTAL: 0.6 mg/dL (ref 0.3–1.2)
BUN: 21 mg/dL — AB (ref 6–20)
CHLORIDE: 103 mmol/L (ref 101–111)
CO2: 26 mmol/L (ref 22–32)
CREATININE: 1.02 mg/dL — AB (ref 0.44–1.00)
Calcium: 9.5 mg/dL (ref 8.9–10.3)
GFR, EST AFRICAN AMERICAN: 58 mL/min — AB (ref >60)
GFR, EST NON AFRICAN AMERICAN: 50 mL/min — AB (ref >60)
Glucose, Bld: 115 mg/dL — ABNORMAL HIGH (ref 65–99)
POTASSIUM: 3.8 mmol/L (ref 3.5–5.1)
Sodium: 137 mmol/L (ref 135–145)
Total Protein: 7.3 g/dL (ref 6.5–8.1)

## 2016-01-21 MED ORDER — FLUOROURACIL CHEMO INJECTION 5 GM/100ML
225.0000 mg/m2/d | INTRAVENOUS | Status: DC
  Administered 2016-01-21: 2650 mg via INTRAVENOUS
  Filled 2016-01-21: qty 53

## 2016-01-21 NOTE — Progress Notes (Signed)
Education given regarding at home infusion pump for 87fu.  Patient and daughter verbalized understanding.

## 2016-01-21 NOTE — Progress Notes (Signed)
Robert Bellow, MD [place] [address]  Rectal cancer [facility])  CURRENT THERAPY: Concurrent chemoradiation with 5FU CI days 1-7 every 7 days beginning on 01/21/2016.  INTERVAL HISTORY: Cheyenne Morris 82 y.o. female returns for followup of Stage IIA (T3N0M0) adenocarcinoma of rectum.    Rectal cancer ([place])   12/20/2015 Tumor Marker CEA 10.3    12/20/2015 Pathology Results Biopsy c/w adenocarcinoma   12/20/2015 Procedure Colonoscopy with Dr. Oneida Alar with partially obstructing mass extending from proximal to mid rectum, 10 cm from anal verge   12/23/2015 Imaging CT abdomen/pelvis rectosigmoid wall thickening two 4 mm perirectal LN, No evidence metastatic disease   01/02/2016 Procedure EUS with Dr. Ardis Hughs nearly circumferential mass distal edge 7 cm from anal verge, No perirectal adenopathy uT3N0   01/16/2016 Procedure Port placed by IR   01/21/2016 -  Chemotherapy 5FU continuous infusion days 1-7 with XRT.     01/21/2016 -  Radiation Therapy Tyler Pita, [place]   Initially, the patient refused chemotherapy when nursing worked the patient up for her office visit today reporting "I want my chemotherapy in [place].  I do not know why I am here."  She admits that she has not seen a medical oncologist in [place].  She notes that she lives very close to [place] and her daughter lives in [place].  She notes that logistically, it will be easier for her to receive her chemotherapy in [place].  She is educated on the importance of beginning chemotherapy today with the start of her XRT.    She is advised on side effects of therapy and we reviewed the risks, benefits, alternatives, and side effects of therapy.  She is agreeable to start chemotherapy by the end of our visit today.  Review of Systems  Constitutional: Negative.  Negative for fever, chills and weight loss.  HENT: Negative.  Negative for congestion and sore throat.   Eyes: Negative.   Respiratory: Negative.   Negative for cough, sputum production and wheezing.   Cardiovascular: Negative.  Negative for chest pain and orthopnea.  Gastrointestinal: Negative.  Negative for nausea, vomiting, diarrhea and constipation.  Genitourinary: Negative.  Negative for dysuria and frequency.  Musculoskeletal: Negative.  Negative for falls.  Skin: Negative.   Neurological: Negative.  Negative for weakness.  Endo/Heme/Allergies: Negative.   Psychiatric/Behavioral: Negative.     Past Medical History  Diagnosis Date  . Diabetes mellitus   . Hypertension   . Rectal cancer [facility])     Past Surgical History  Procedure Laterality Date  . Colonoscopy    . Cataract extraction, bilateral    . Colonoscopy  01/05/2012    Dr Jenkins:Sessile polyp in the ascending colon/Normal colonoscopy otherwise, path tubular adenoma  . Colonoscopy N/A 12/20/2015    Procedure: COLONOSCOPY;  Surgeon: Danie Binder, MD;  [facility];  Service: Endoscopy;  Laterality: N/A;  0930  . Esophagogastroduodenoscopy N/A 12/20/2015    Procedure: ESOPHAGOGASTRODUODENOSCOPY (EGD);  Surgeon: Danie Binder, MD;  Location[facility];  Service: Endoscopy;  Laterality: N/A;  . Eus N/A 01/02/2016    Procedure: LOWER ENDOSCOPIC ULTRASOUND (EUS);  Surgeon: [name], MD;  Location: [facility];  Service: Endoscopy;  Laterality: N/A;    Family History  Problem Relation Age of Onset  . Diabetes Son   . Colon cancer Neg Hx     Social History   Social History  . Marital Status: Married    Spouse  Name: N/A  . Number of Children: N/A  . Years of Education: N/A   Social History Main Topics  . Smoking status: Never Smoker   . Smokeless tobacco: Never Used     Comment: Never smoked  . Alcohol Use: No  . Drug Use: No  . Sexual Activity: No   Other Topics Concern  . None   Social History Narrative     PHYSICAL EXAMINATION  ECOG PERFORMANCE STATUS: 0 - Asymptomatic  There were no vitals filed for this visit.  Blood  pressure 143/69 Pulse 79 Respirations 20 Temperature 98.3F Oxygen saturation 100% on room air.  GENERAL:alert, no distress, well nourished, well developed, comfortable, cooperative, smiling and accompanied by her daughter, Butch Penny. SKIN: skin color, texture, turgor are normal, no rashes or significant lesions HEAD: Normocephalic, No masses, lesions, tenderness or abnormalities EYES: normal, EOMI, Conjunctiva are pink and non-injected EARS: External ears normal OROPHARYNX:lips, buccal mucosa, and tongue normal and mucous membranes are moist  NECK: supple, no adenopathy, thyroid normal size, non-tender, without nodularity, trachea midline LYMPH:  no palpable lymphadenopathy BREAST:not examined LUNGS: clear to auscultation and percussion HEART: regular rate & rhythm, no murmurs, no gallops, S1 normal and S2 normal ABDOMEN:abdomen soft, non-tender and normal bowel sounds BACK: Back symmetric, no curvature. EXTREMITIES:less then 2 second capillary refill, no joint deformities, effusion, or inflammation, no skin discoloration, no cyanosis  NEURO: alert & oriented x 3 with fluent speech, no focal motor/sensory deficits, gait normal   LABORATORY DATA: CBC    Component Value Date/Time   WBC 6.0 01/21/2016 1730   RBC 3.48* 01/21/2016 1730   HGB 9.7* 01/21/2016 1730   HCT 30.6* 01/21/2016 1730   PLT 240 01/21/2016 1730   MCV 87.9 01/21/2016 1730   MCH 27.9 01/21/2016 1730   MCHC 31.7 01/21/2016 1730   RDW 15.8* 01/21/2016 1730   LYMPHSABS 1.6 01/21/2016 1730   MONOABS 0.6 01/21/2016 1730   EOSABS 0.1 01/21/2016 1730   BASOSABS 0.0 01/21/2016 1730      Chemistry      Component Value Date/Time   NA 137 01/21/2016 1730   K 3.8 01/21/2016 1730   CL 103 01/21/2016 1730   CO2 26 01/21/2016 1730   BUN 21* 01/21/2016 1730   CREATININE 1.02* 01/21/2016 1730      Component Value Date/Time   CALCIUM 9.5 01/21/2016 1730   ALKPHOS 49 01/21/2016 1730   AST 17 01/21/2016 1730   ALT 10*  01/21/2016 1730   BILITOT 0.6 01/21/2016 1730        PENDING LABS:   RADIOGRAPHIC STUDIES:  Ct Abdomen Pelvis W Contrast  12/23/2015  ADDENDUM REPORT: 12/23/2015 16:43 CONTRAST:  100 cc Isovue-300. Electronically Signed   By: Lorin Picket M.D.   On: 12/23/2015 16:43  12/23/2015  CLINICAL DATA:  Rectal mass seen on colonoscopy. EXAM: CT ABDOMEN AND PELVIS WITH CONTRAST TECHNIQUE: Multidetector CT imaging of the abdomen and pelvis was performed using the standard protocol following bolus administration of intravenous contrast. COMPARISON:  None. FINDINGS: Lower chest: Lung bases show no acute findings. Heart is at the upper limits of normal in size. Coronary artery calcification. No pericardial or pleural effusion. Hepatobiliary: The liver and gallbladder are unremarkable. No biliary ductal dilatation. Pancreas: Negative. Spleen: Negative. Adrenals/Urinary Tract: Adrenal glands are unremarkable. Sub cm low-attenuation lesions in the kidneys are too small to characterize but statistically, cysts are most likely. Ureters are decompressed. Bladder is unremarkable. Stomach/Bowel: Stomach, small bowel, appendix and majority of the colon are  unremarkable. There is focal thickening of the rectosigmoid colon (series 2, image 65). Vascular/Lymphatic: Atherosclerotic calcification of the arterial vasculature without abdominal aortic aneurysm. 4 mm left perirectal lymph nodes (series 2, images 61 and 66). Otherwise, no pathologically enlarged lymph nodes. Reproductive: Uterus and ovaries are visualized. Other: No free fluid.  Mesenteries and peritoneum are unremarkable. Musculoskeletal: No worrisome lytic or sclerotic lesions. Degenerative changes are seen in the spine. IMPRESSION: 1. Rectosigmoid wall thickening likely corresponds to findings on recent colonoscopy. Two 4 mm perirectal lymph nodes may be metastatic. No evidence of distant metastatic disease. 2. Coronary artery calcification. Electronically  Signed: By: Lorin Picket M.D. On: 12/23/2015 16:35   Ir Fluoro Guide Cv Line Right  01/16/2016  CLINICAL DATA:  Rectal carcinoma, needs long-term venous access for chemotherapy EXAM: TUNNELED PORT CATHETER PLACEMENT WITH ULTRASOUND AND FLUOROSCOPIC GUIDANCE FLUOROSCOPY TIME:  0.2 minute, 25 uGym2 DAP ANESTHESIA/SEDATION: Intravenous Fentanyl and Versed were administered as conscious sedation during continuous monitoring of the patient's level of consciousness and physiological / cardiorespiratory status by the radiology RN, with a total moderate sedation time of 16 minutes. TECHNIQUE: The procedure, risks, benefits, and alternatives were explained to the patient. Questions regarding the procedure were encouraged and answered. The patient understands and consents to the procedure. As antibiotic prophylaxis, cefazolin 2 g was ordered pre-procedure and administered intravenously within one hour of incision. Patency of the right IJ vein was confirmed with ultrasound with image documentation. An appropriate skin site was determined. Skin site was marked. Region was prepped using maximum barrier technique including cap and mask, sterile gown, sterile gloves, large sterile sheet, and Chlorhexidine as cutaneous antisepsis. The region was infiltrated locally with 1% lidocaine. Under real-time ultrasound guidance, the right IJ vein was accessed with a 21 gauge micropuncture needle; the needle tip within the vein was confirmed with ultrasound image documentation. Needle was exchanged over a 018 guidewire for transitional dilator which allowed passage of the [facility] wire into the IVC. Over this, the transitional dilator was exchanged for a 5 [place] MPA catheter. A small incision was made on the right anterior chest wall and a subcutaneous pocket fashioned. The power-injectable port was positioned and its catheter tunneled to the right IJ dermatotomy site. The MPA catheter was exchanged over an Amplatz wire for a peel-away  sheath, through which the port catheter, which had been trimmed to the appropriate length, was advanced and positioned under fluoroscopy with its tip at the cavoatrial junction. Spot chest radiograph confirms good catheter position and no pneumothorax. The pocket was closed with deep interrupted and subcuticular continuous 3-0 Monocryl sutures. The port was flushed per protocol. The incisions were covered with Dermabond then covered with a sterile dressing. COMPLICATIONS: COMPLICATIONS None immediate IMPRESSION: Technically successful right IJ power-injectable port catheter placement. Ready for routine use. Electronically Signed   By: Lucrezia Europe M.D.   On: 01/16/2016 15:44   Ir US Guide Vasc Access Right  01/16/2016  CLINICAL DATA:  Rectal carcinoma, needs long-term venous access for chemotherapy EXAM: TUNNELED PORT CATHETER PLACEMENT WITH ULTRASOUND AND FLUOROSCOPIC GUIDANCE FLUOROSCOPY TIME:  0.2 minute, 25 uGym2 DAP ANESTHESIA/SEDATION: Intravenous Fentanyl and Versed were administered as conscious sedation during continuous monitoring of the patient's level of consciousness and physiological / cardiorespiratory status by the radiology RN, with a total moderate sedation time of 16 minutes. TECHNIQUE: The procedure, risks, benefits, and alternatives were explained to the patient. Questions regarding the procedure were encouraged and answered. The patient understands and consents to the procedure. As  antibiotic prophylaxis, cefazolin 2 g was ordered pre-procedure and administered intravenously within one hour of incision. Patency of the right IJ vein was confirmed with ultrasound with image documentation. An appropriate skin site was determined. Skin site was marked. Region was prepped using maximum barrier technique including cap and mask, sterile gown, sterile gloves, large sterile sheet, and Chlorhexidine as cutaneous antisepsis. The region was infiltrated locally with 1% lidocaine. Under real-time ultrasound  guidance, the right IJ vein was accessed with a 21 gauge micropuncture needle; the needle tip within the vein was confirmed with ultrasound image documentation. Needle was exchanged over a 018 guidewire for transitional dilator which allowed passage of the [facility] wire into the IVC. Over this, the transitional dilator was exchanged for a 5 [place] MPA catheter. A small incision was made on the right anterior chest wall and a subcutaneous pocket fashioned. The power-injectable port was positioned and its catheter tunneled to the right IJ dermatotomy site. The MPA catheter was exchanged over an Amplatz wire for a peel-away sheath, through which the port catheter, which had been trimmed to the appropriate length, was advanced and positioned under fluoroscopy with its tip at the cavoatrial junction. Spot chest radiograph confirms good catheter position and no pneumothorax. The pocket was closed with deep interrupted and subcuticular continuous 3-0 Monocryl sutures. The port was flushed per protocol. The incisions were covered with Dermabond then covered with a sterile dressing. COMPLICATIONS: COMPLICATIONS None immediate IMPRESSION: Technically successful right IJ power-injectable port catheter placement. Ready for routine use. Electronically Signed   By: Lucrezia Europe M.D.   On: 01/16/2016 15:44     PATHOLOGY:    ASSESSMENT AND PLAN:  Rectal cancer ([place]) Stage IIA (T3N0M0) adenocarcinoma of rectum.  Beginning chemoradiation today, 01/21/2016 with 5FU CI days 1-7 every 7 days during XRT.    Oncology history is updated.  Staging in [facility] problem list is updated.  Pre-treatment labs today as ordered: CBC diff, CMET.  I personally reviewed and went over laboratory results with the patient.  The results are noted within this dictation.  Labs meet treatment paramaters today.  Anemia is below baseline, but not prohibitive of treatment today.  Renal function is at baseline.  Other labs are unimpressive.  She does have  iron deficiency anemia as well, likely secondary to chronic GI blood loss from rectal cancer.  She was started on PO Niferex daily earlier this month according to lab review by Dr. Whitney Muse.  Iron studies were not drawn today as they were not ordered at time of lab draw appointment.   Logistically, the patient reports that given her living location, it would be easier for the patient to receive her chemotherapy in [place].  The patient is educated on her upcoming treatment plan including neoadjuvant chemoXRT, followed by surgical resection, followed by 6 months of adjuvant treatment, followed by surveillance.  She notes that she is interested in having this all performed in [place].  As a result, I called Dr. Benay Spice who is gracious enough to take the patient in transfer of medical oncology care.  At his request, I have messaged his nurse navigator to help coordinate appointment.  The patient is advised that if Dr. Benay Spice is unable to see the patient prior-to or at the time-of her pump D/C and exchange, we will do that here until she is seen in Dr. Gearldine Shown clinic in order to keep her treatment progressing in a timely fashion.  She is advised that once she transfers her medical oncology care  to Dr. Benay Spice, it is recommended that she remain with him throughout her oncology treatment and surveillance as to maintain continuity of care.  For now, we will maintain her scheduled appointments here until there is confirmation of the patient's appointment(s) with Dr. Benay Spice, at which time we will cancel appointments accordingly.    I recommend iron studies in 1 week.  If no improvement, she would benefit from IV iron replacement according to her iron deficit calculation.  With most recent labs, her iron deficit is ~ 575 mg with a target HGB of 14 g/dL.  Will defer to Dr. Benay Spice.  She also has an element of chronic renal disease, Stage III, which may be participating in her anemia.    ORDERS PLACED  FOR THIS ENCOUNTER: No orders of the defined types were placed in this encounter.    MEDICATIONS PRESCRIBED THIS ENCOUNTER: No orders of the defined types were placed in this encounter.    THERAPY PLAN:  Will transfer the patient's medical oncology care to [facility].  All questions were answered. The patient knows to call the clinic with any problems, questions or concerns. We can certainly see the patient much sooner if necessary.  Patient and plan discussed with Dr. Ancil Linsey and she is in agreement with the aforementioned.   This note is electronically signed by: Doy Mince 01/21/2016 6:59 PM

## 2016-01-21 NOTE — Patient Instructions (Signed)
[place] [facility] Discharge Instructions  RECOMMENDATIONS MADE BY THE CONSULTANT AND ANY TEST RESULTS WILL BE SENT TO YOUR REFERRING PHYSICIAN.  Exam done and seen today by Kirby Crigler Get treated today and pump off next week. We will work on transfer care to [facility]. Return to see the Doctor as scheduled Call the clinic for any concerns or questions.  Thank you for choosing [place] at [facility] to provide your oncology and hematology care.  To afford each patient quality time with our provider, please arrive at least 15 minutes before your scheduled appointment time.   Beginning January 23rd 2017 lab work for the [facility] will be done in the  Main lab at [facility] on 1st floor. If you have a lab appointment with the [facility] please come in thru the  Main Entrance and check in at the main information desk  You need to re-schedule your appointment should you arrive 10 or more minutes late.  We strive to give you quality time with our providers, and arriving late affects you and other patients whose appointments are after yours.  Also, if you no show three or more times for appointments you may be dismissed from the clinic at the providers discretion.     Again, thank you for choosing [facility].  Our hope is that these requests will decrease the amount of time that you wait before being seen by our physicians.       _____________________________________________________________  Should you have questions after your visit to [facility], please contact our office at [phone] between the hours of 8:30 a.m. and 4:30 p.m.  Voicemails left after 4:30 p.m. will not be returned until the following business day.  For prescription refill requests, have your pharmacy contact our office.         Resources For Cancer Patients and their Caregivers ? American Cancer Society: Can assist with  transportation, wigs, general needs, runs Look Good Feel Better.        629 535 0165 ? Cancer Care: Provides financial assistance, online support groups, medication/co-pay assistance.  1-800-813-HOPE 228-795-9667) ? [place] Assists [place] cancer patients and their families through emotional , educational and financial support.  4786268164 ? [place] DSS Where to apply for food stamps, Medicaid and utility assistance. 417 532 9297 ? [employer]ATS: Transportation to medical appointments. (248)257-9521 ? Social Security Administration: May apply for disability if have a Stage IV cancer. 315-570-6121 (438)721-2422 ? [employer], Disability and Transit Services: Assists with nutrition, care and transit needs. [place] Support Programs: @10RELATIVEDAYS @ > Cancer Support Group  2nd Tuesday of the month 1pm-2pm, Journey Room  > Creative Journey  3rd Tuesday of the month 1130am-1pm, Journey Room  > Look Good Feel Better  1st Wednesday of the month 10am-12 noon, Journey Room (Call [place] to register [phone])

## 2016-01-21 NOTE — Patient Instructions (Signed)
[facility] Discharge Instructions for Patients Receiving Chemotherapy   Beginning January 23rd 2017 lab work for the [facility] will be done in the  Main lab at [facility] on 1st floor. If you have a lab appointment with the [facility] please come in thru the  Main Entrance and check in at the main information desk   Today you received the following chemotherapy agent: 42fu home infusion pump.     If you develop nausea and vomiting, or diarrhea that is not controlled by your medication, call the clinic.  The clinic phone number is [phone]. Office hours are Monday-Friday 8:30am-5:00pm.  BELOW ARE SYMPTOMS THAT SHOULD BE REPORTED IMMEDIATELY:  *FEVER GREATER THAN 101.0 F  *CHILLS WITH OR WITHOUT FEVER  NAUSEA AND VOMITING THAT IS NOT CONTROLLED WITH YOUR NAUSEA MEDICATION  *UNUSUAL SHORTNESS OF BREATH  *UNUSUAL BRUISING OR BLEEDING  TENDERNESS IN MOUTH AND THROAT WITH OR WITHOUT PRESENCE OF ULCERS  *URINARY PROBLEMS  *BOWEL PROBLEMS  UNUSUAL RASH Items with * indicate a potential emergency and should be followed up as soon as possible. If you have an emergency after office hours please contact your primary care physician or go to the nearest emergency department.  Please call the clinic during office hours if you have any questions or concerns.   You may also contact the Patient Navigator at [phone] should you have any questions or need assistance in obtaining follow up care.      Resources For Cancer Patients and their Caregivers ? American Cancer Society: Can assist with transportation, wigs, general needs, runs Look Good Feel Better.        848-799-7696 ? Cancer Care: Provides financial assistance, online support groups, medication/co-pay assistance.  1-800-813-HOPE 432-743-8031) ? [place] Assists [place] cancer patients and their families through emotional , educational and financial support.   609-807-2879 ? [place] DSS Where to apply for food stamps, Medicaid and utility assistance. 6011554443 ? RCATS: Transportation to medical appointments. 563-839-0888 ? Social Security Administration: May apply for disability if have a Stage IV cancer. 854-517-4559 [phone])782-3950 ? [employer], Disability and Transit Services: Assists with nutrition, care and transit needs. (714)553-3883

## 2016-01-21 NOTE — Assessment & Plan Note (Signed)
Stage IIA (T3N0M0) adenocarcinoma of rectum.  Beginning chemoradiation today, 01/21/2016 with 5FU CI days 1-7 every 7 days during XRT.    Oncology history is updated.  Staging in [facility] problem list is updated.  Pre-treatment labs today as ordered: CBC diff, CMET.  I personally reviewed and went over laboratory results with the patient.  The results are noted within this dictation.  Labs meet treatment paramaters today.  Anemia is below baseline, but not prohibitive of treatment today.  Renal function is at baseline.  Other labs are unimpressive.  She does have iron deficiency anemia as well, likely secondary to chronic GI blood loss from rectal cancer.  She was started on PO Niferex daily earlier this month according to lab review by Dr. Whitney Muse.  Iron studies were not drawn today as they were not ordered at time of lab draw appointment.   Logistically, the patient reports that given her living location, it would be easier for the patient to receive her chemotherapy in [place].  The patient is educated on her upcoming treatment plan including neoadjuvant chemoXRT, followed by surgical resection, followed by 6 months of adjuvant treatment, followed by surveillance.  She notes that she is interested in having this all performed in [place].  As a result, I called Dr. Benay Spice who is gracious enough to take the patient in transfer of medical oncology care.  At his request, I have messaged his nurse navigator to help coordinate appointment.  The patient is advised that if Dr. Benay Spice is unable to see the patient prior-to or at the time-of her pump D/C and exchange, we will do that here until she is seen in Dr. Gearldine Shown clinic in order to keep her treatment progressing in a timely fashion.  She is advised that once she transfers her medical oncology care to Dr. Benay Spice, it is recommended that she remain with him throughout her oncology treatment and surveillance as to maintain continuity of care.  For  now, we will maintain her scheduled appointments here until there is confirmation of the patient's appointment(s) with Dr. Benay Spice, at which time we will cancel appointments accordingly.    I recommend iron studies in 1 week.  If no improvement, she would benefit from IV iron replacement according to her iron deficit calculation.  With most recent labs, her iron deficit is ~ 575 mg with a target HGB of 14 g/dL.  Will defer to Dr. Benay Spice.  She also has an element of chronic renal disease, Stage III, which may be participating in her anemia.

## 2016-01-22 ENCOUNTER — Ambulatory Visit
Admission: RE | Admit: 2016-01-22 | Discharge: 2016-01-22 | Disposition: A | Discharge status: Home / Self Care | Payer: Medicare Other | Source: Ambulatory Visit | Attending: Radiation Oncology | Admitting: Radiation Oncology

## 2016-01-22 ENCOUNTER — Telehealth: Payer: Self-pay | Admitting: *Deleted

## 2016-01-22 DIAGNOSIS — C20 Malignant neoplasm of rectum: Secondary | ICD-10-CM

## 2016-01-22 DIAGNOSIS — Z510 Encounter for antineoplastic radiation therapy: Secondary | ICD-10-CM | POA: Diagnosis not present

## 2016-01-22 LAB — CEA: CEA: 9.3 ng/mL — AB (ref 0.0–4.7)

## 2016-01-22 NOTE — Telephone Encounter (Signed)
Oncology Nurse Navigator Documentation  Oncology Nurse Navigator Flowsheets 01/22/2016  Navigator Location [facility]  Navigator Encounter Type Treatment  Abnormal Finding Date 12/20/2015  Confirmed Diagnosis Date 12/20/2015  Treatment Initiated Date 01/21/2016  Patient Visit Type RadOnc  Treatment Phase Active Tx--RT/5FU infusion via pump  Barriers/Navigation Needs Coordination of Care;Education--Transfer of medical oncology care to [facility] per request of Robynn Pane at Pump Back Accessing Care/ Finding Providers--Dr. Burr Medico will see per recommendation of Dr. Benay Spice  Interventions Coordination of Care  Coordination of Care Appts--will see Dr. Burr Medico 6/27 at 11:45, then have chemo tx  Acuity Level 2  Time Spent with Patient 29  Spoke with patient in radiation oncology today and provided her appointment for lab/OV on 01/28/16 to see Dr. Burr Medico. Provided her contact information for Dr. Burr Medico and this nurse navigator. Explained role of nurse navigator. Message to HIM to schedule appointments as well as infusion room scheduler.

## 2016-01-22 NOTE — Telephone Encounter (Signed)
Per staff message I have scheduled chemo

## 2016-01-23 ENCOUNTER — Ambulatory Visit
Admission: RE | Admit: 2016-01-23 | Discharge: 2016-01-23 | Disposition: A | Discharge status: Home / Self Care | Payer: Medicare Other | Source: Ambulatory Visit | Attending: Radiation Oncology | Admitting: Radiation Oncology

## 2016-01-23 DIAGNOSIS — Z510 Encounter for antineoplastic radiation therapy: Secondary | ICD-10-CM | POA: Diagnosis not present

## 2016-01-23 NOTE — Progress Notes (Signed)
24 hours call back- pt states that she has done well that she got a little nauseated, but she has her nausea meds on hand if she needs them

## 2016-01-24 ENCOUNTER — Ambulatory Visit
Admission: RE | Admit: 2016-01-24 | Discharge: 2016-01-24 | Disposition: A | Discharge status: Home / Self Care | Payer: Medicare Other | Source: Ambulatory Visit | Attending: Radiation Oncology | Admitting: Radiation Oncology

## 2016-01-24 ENCOUNTER — Encounter: Payer: Self-pay | Admitting: Radiation Oncology

## 2016-01-24 VITALS — BP 143/76 | HR 80 | Temp 98.2°F | Resp 16 | Wt 139.8 lb

## 2016-01-24 DIAGNOSIS — C20 Malignant neoplasm of rectum: Secondary | ICD-10-CM

## 2016-01-24 DIAGNOSIS — Z510 Encounter for antineoplastic radiation therapy: Secondary | ICD-10-CM | POA: Diagnosis not present

## 2016-01-24 NOTE — Progress Notes (Signed)
  Radiation Oncology         [phone]   Name: Cheyenne Morris MRN: [id number]   Date: 01/24/2016  DOB: 1931-01-21   Weekly Radiation Therapy Management    ICD-9-CM ICD-10-CM   1. Rectal cancer (HCC) 154.1 C20     Current Dose: 7.2 Gy  Planned Dose:  45 Gy  Narrative The patient presents for routine under treatment assessment.  Denies pain, rectal irritation, or fatigue. Reports one episode of nausea this week. Continuous chemotherapy noted, by the nurse, via power port. Reports drinking prune juice to manage constipation yesterday.  Set-up films were reviewed. The chart was checked.  Physical Findings  weight is 139 lb 12.8 oz (63.413 kg). Her oral temperature is 98.2 F (36.8 C). Her blood pressure is 143/76 and her pulse is 80. Her respiration is 16 and oxygen saturation is 100%. . Weight essentially stable.  No significant changes.  Impression The patient is tolerating radiation.  Plan Continue treatment as planned.     Sheral Apley Tammi Klippel, M.D.  This document serves as a record of services personally performed by Tyler Pita, MD. It was created on his behalf by Darcus Austin, a trained medical scribe. The creation of this record is based on the scribe's personal observations and the provider's statements to them. This document has been checked and approved by the attending provider.

## 2016-01-24 NOTE — Progress Notes (Addendum)
Weight and vitals stable. Denies pain. Reports one episode of nausea this week. Continuous chemotherapy noted via power port. Reports drinking prune juice to manage constipation yesterday. Denies fatigue. Denies rectal irritation. Oriented patient to staff and routine of the clinic. Provided patient with RADIATION THERAPY AND YOU handbook then reviewed pertinent information. Educated patient reference potential side effects and management such as, fatigue, diarrhea, and rectal irritation. Provided patient with my business card and encouraged her to call with needs. Answered all patient questions to the best of my ability. Patient verbalized understanding of all reviewed.   BP 143/76 mmHg  Pulse 80  Temp(Src) 98.2 F (36.8 C) (Oral)  Resp 16  Wt 139 lb 12.8 oz (63.413 kg)  SpO2 100% Wt Readings from Last 3 Encounters:  01/24/16 139 lb 12.8 oz (63.413 kg)  01/21/16 138 lb 4.8 oz (62.732 kg)  01/16/16 138 lb 6.4 oz (62.778 kg)

## 2016-01-27 ENCOUNTER — Ambulatory Visit
Admission: RE | Admit: 2016-01-27 | Discharge: 2016-01-27 | Disposition: A | Discharge status: Home / Self Care | Payer: Medicare Other | Source: Ambulatory Visit | Attending: Radiation Oncology | Admitting: Radiation Oncology

## 2016-01-27 DIAGNOSIS — Z510 Encounter for antineoplastic radiation therapy: Secondary | ICD-10-CM | POA: Diagnosis not present

## 2016-01-28 ENCOUNTER — Inpatient Hospital Stay (HOSPITAL_COMMUNITY): Payer: Medicare Other

## 2016-01-28 ENCOUNTER — Encounter: Payer: Self-pay | Admitting: Hematology

## 2016-01-28 ENCOUNTER — Other Ambulatory Visit: Payer: Self-pay | Admitting: *Deleted

## 2016-01-28 ENCOUNTER — Ambulatory Visit (HOSPITAL_BASED_OUTPATIENT_CLINIC_OR_DEPARTMENT_OTHER): Payer: Medicare Other | Admitting: Hematology

## 2016-01-28 ENCOUNTER — Encounter: Payer: Self-pay | Admitting: Pharmacist

## 2016-01-28 ENCOUNTER — Ambulatory Visit
Admission: RE | Admit: 2016-01-28 | Discharge: 2016-01-28 | Disposition: A | Discharge status: Home / Self Care | Payer: Medicare Other | Source: Ambulatory Visit | Attending: Radiation Oncology | Admitting: Radiation Oncology

## 2016-01-28 ENCOUNTER — Other Ambulatory Visit (HOSPITAL_BASED_OUTPATIENT_CLINIC_OR_DEPARTMENT_OTHER): Payer: Medicare Other

## 2016-01-28 ENCOUNTER — Ambulatory Visit (HOSPITAL_COMMUNITY): Payer: Medicare Other | Admitting: Oncology

## 2016-01-28 ENCOUNTER — Telehealth: Payer: Self-pay | Admitting: Hematology

## 2016-01-28 ENCOUNTER — Ambulatory Visit (HOSPITAL_BASED_OUTPATIENT_CLINIC_OR_DEPARTMENT_OTHER): Payer: Medicare Other

## 2016-01-28 VITALS — BP 149/58 | HR 81 | Temp 98.3°F | Resp 18 | Ht 62.0 in | Wt 138.7 lb

## 2016-01-28 DIAGNOSIS — D509 Iron deficiency anemia, unspecified: Secondary | ICD-10-CM

## 2016-01-28 DIAGNOSIS — C20 Malignant neoplasm of rectum: Secondary | ICD-10-CM

## 2016-01-28 DIAGNOSIS — E119 Type 2 diabetes mellitus without complications: Secondary | ICD-10-CM

## 2016-01-28 DIAGNOSIS — Z5111 Encounter for antineoplastic chemotherapy: Secondary | ICD-10-CM | POA: Diagnosis present

## 2016-01-28 DIAGNOSIS — I10 Essential (primary) hypertension: Secondary | ICD-10-CM | POA: Diagnosis not present

## 2016-01-28 DIAGNOSIS — Z510 Encounter for antineoplastic radiation therapy: Secondary | ICD-10-CM | POA: Diagnosis not present

## 2016-01-28 LAB — CBC WITH DIFFERENTIAL/PLATELET
BASO%: 0.3 % (ref 0.0–2.0)
Basophils Absolute: 0 10*3/uL (ref 0.0–0.1)
EOS%: 3.1 % (ref 0.0–7.0)
Eosinophils Absolute: 0.1 10*3/uL (ref 0.0–0.5)
HEMATOCRIT: 31.9 % — AB (ref 34.8–46.6)
HEMOGLOBIN: 10.1 g/dL — AB (ref 11.6–15.9)
LYMPH#: 0.8 10*3/uL — AB (ref 0.9–3.3)
LYMPH%: 25.1 % (ref 14.0–49.7)
MCH: 27.3 pg (ref 25.1–34.0)
MCHC: 31.7 g/dL (ref 31.5–36.0)
MCV: 86.2 fL (ref 79.5–101.0)
MONO#: 0.2 10*3/uL (ref 0.1–0.9)
MONO%: 4.6 % (ref 0.0–14.0)
NEUT#: 2.2 10*3/uL (ref 1.5–6.5)
NEUT%: 66.9 % (ref 38.4–76.8)
PLATELETS: 207 10*3/uL (ref 145–400)
RBC: 3.7 10*6/uL (ref 3.70–5.45)
RDW: 15.7 % — AB (ref 11.2–14.5)
WBC: 3.3 10*3/uL — ABNORMAL LOW (ref 3.9–10.3)

## 2016-01-28 LAB — COMPREHENSIVE METABOLIC PANEL
ALBUMIN: 3.6 g/dL (ref 3.5–5.0)
ALK PHOS: 58 U/L (ref 40–150)
ALT: 13 U/L (ref 0–55)
ANION GAP: 8 meq/L (ref 3–11)
AST: 16 U/L (ref 5–34)
BILIRUBIN TOTAL: 0.6 mg/dL (ref 0.20–1.20)
BUN: 19.3 mg/dL (ref 7.0–26.0)
CALCIUM: 9.5 mg/dL (ref 8.4–10.4)
CHLORIDE: 104 meq/L (ref 98–109)
CO2: 24 mEq/L (ref 22–29)
CREATININE: 1 mg/dL (ref 0.6–1.1)
EGFR: 61 mL/min/{1.73_m2} — ABNORMAL LOW (ref >90)
Glucose: 95 mg/dl (ref 70–140)
Potassium: 3.9 mEq/L (ref 3.5–5.1)
Sodium: 136 mEq/L (ref 136–145)
TOTAL PROTEIN: 7.2 g/dL (ref 6.4–8.3)

## 2016-01-28 MED ORDER — SODIUM CHLORIDE 0.9 % IV SOLN
225.0000 mg/m2/d | INTRAVENOUS | Status: DC
  Administered 2016-01-28: 2650 mg via INTRAVENOUS
  Filled 2016-01-28: qty 53

## 2016-01-28 MED ORDER — SODIUM CHLORIDE 0.9% FLUSH
10.0000 mL | INTRAVENOUS | Status: DC | PRN
  Administered 2016-01-28: 10 mL
  Filled 2016-01-28: qty 10

## 2016-01-28 NOTE — Progress Notes (Signed)
[place]  [phone] [phone]  Clinic Follow up Note   Patient Care Team: Lemmie Evens, MD as PCP - General (Family Medicine) Danie Binder, MD as Consulting Physician (Gastroenterology) 01/28/2016  SUMMARY OF ONCOLOGIC HISTORY:   Rectal cancer ([place])   12/20/2015 Tumor Marker CEA 10.3    12/20/2015 Pathology Results Biopsy c/w adenocarcinoma   12/20/2015 Procedure Colonoscopy with Dr. Oneida Alar with partially obstructing mass extending from proximal to mid rectum, 10 cm from anal verge   12/23/2015 Imaging CT abdomen/pelvis rectosigmoid wall thickening two 4 mm perirectal LN, No evidence metastatic disease   01/02/2016 Procedure EUS with Dr. Ardis Hughs nearly circumferential mass distal edge 7 cm from anal verge, No perirectal adenopathy uT3N0   01/16/2016 Procedure Port placed by IR   01/21/2016 -  Chemotherapy 5FU continuous infusion days 1-7 with XRT.     01/21/2016 -  Radiation Therapy Tyler Pita, [place]   HISTORY OF PRESENTING ILLNESS (Dr. Whitney Muse, 01/09/2016):  Cheyenne Morris 82 y.o. female is here because of newly diagnosed adenocarcinoma of the rectum. She comes to the [place] today accompanied by several family members, her husband Izell Hadar, two daughters, and granddaughter. Cheyenne Morris appears very fit and young for her age.  She saw a little blood in her stool, starting about a year ago. She had also lost a little weight, saying "it just fell off." She had had a colonoscopy before, and went to see Dr. Oneida Alar for another when she noticed the blood in her stool. She was told to go get the colonoscopy after noticing the symptoms. She'd only ever had one polyp removed during a colonoscopy before, and had "8" removed during this most recent one.  After the colonoscopy, she was told that they found cancer. At that time, Mrs. Lello had to go see Dr. Ardis Hughs in [place]. He did pre-treatment staging studies for her rectal cancer, and made the  appointment to come here to [facility]. After this, she notes that she had to go down to [place]. She spoke to a lady there. She says that, in [place], "they just weighed me and talked to me about the cancer." She cannot remember the name of the female doctor she spoke with.  After an extensive overview of her cancer and treatment trajectory, one of her daughters asks "are you okay?" and [name] notes "I'm okay." Her daughters were advised that they can come and attend other appointments to remain on top of everything.  [name] notes that she doesn't have any rectal pain at present. She notes that sometimes it will pain, especially if she's been sitting a long time, but not often.  Still regularly receives mammograms.  During the physical exam, she denies any chest pain, and denies shortness of breath. She also denies any abdominal pain. She notes that she is still having some bleeding in her stool, "sometimes, not all the time."  She says she has planted a garden this year, with tomatoes, cucumbers, and squash. She is still active and able to do what she wants without difficulty.  She notes she eats. At the end of her appointment, she says "I'm hungry!" and notes that she wants some potatoes and gravy. She then says "I'm not that hungry; I just wanted to laugh!" She notes that her mood is optimistic, she has a good support system.   CURRENT THERAPY: concurrent chemotherapy and radiaiton, 5-fu 225mg /m2/day continuously, started on 01/21/2016   INTERVAL HISTORY: Mrs Morris was previously seen  by my partner Dr. Whitney Muse at [facility] She lives in [place], and is receiving concurrent radiation at our cancer center, he presents to my clinic to transfer her medical oncology care to [place]. He has started concurrent chemoradiation a week ago, has been tolerating well, no significant nausea, diarrhea. Appetite and energy level are decent, no other new complaints.   REVIEW OF SYSTEMS:     Constitutional: Denies fevers, chills or abnormal weight loss Eyes: Denies blurriness of vision Ears, nose, mouth, throat, and face: Denies mucositis or sore throat Respiratory: Denies cough, dyspnea or wheezes Cardiovascular: Denies palpitation, chest discomfort or lower extremity swelling Gastrointestinal:  Denies nausea, heartburn or change in bowel habits Skin: Denies abnormal skin rashes Lymphatics: Denies new lymphadenopathy or easy bruising Neurological:Denies numbness, tingling or new weaknesses Behavioral/Psych: Mood is stable, no new changes  All other systems were reviewed with the patient and are negative.  MEDICAL HISTORY:  Past Medical History  Diagnosis Date  . Diabetes mellitus   . Hypertension   . Rectal cancer [facility])     SURGICAL HISTORY: Past Surgical History  Procedure Laterality Date  . Colonoscopy    . Cataract extraction, bilateral    . Colonoscopy  01/05/2012    Dr Jenkins:Sessile polyp in the ascending colon/Normal colonoscopy otherwise, path tubular adenoma  . Colonoscopy N/A 12/20/2015    Procedure: COLONOSCOPY;  Surgeon: Danie Binder, MD;  Location: [facility];  Service: Endoscopy;  Laterality: N/A;  0930  . Esophagogastroduodenoscopy N/A 12/20/2015    Procedure: ESOPHAGOGASTRODUODENOSCOPY (EGD);  Surgeon: Danie Binder, MD;  Location: [facility];  Service: Endoscopy;  Laterality: N/A;  . Eus N/A 01/02/2016    Procedure: LOWER ENDOSCOPIC ULTRASOUND (EUS);  Surgeon: [name], MD;  Location: [facility];  Service: Endoscopy;  Laterality: N/A;    I have reviewed the social history and family history with the patient and they are unchanged from previous note.  ALLERGIES:  is allergic to lime.  MEDICATIONS:  Current Outpatient Prescriptions  Medication Sig Dispense Refill  . Calcium Carbonate-Vitamin D (CALCIUM-VITAMIN D) 600-200 MG-UNIT CAPS Take 1 tablet by mouth every morning.     . fluorouracil CALGB 16109 in sodium chloride 0.9 % 150  mL Inject into the vein. NOTE: this medication hasn't started yet    . glimepiride (AMARYL) 2 MG tablet Take 2 mg by mouth daily before breakfast.      . iron polysaccharides (NIFEREX) 150 MG capsule Take 1 capsule (150 mg total) by mouth daily. 30 capsule 2  . latanoprost (XALATAN) 0.005 % ophthalmic solution Place 1 drop into both eyes at bedtime.     . lidocaine-prilocaine (EMLA) cream Apply a quarter size amount to port site 1 hour prior to chemo. Do not rub in. Cover with plastic wrap. 30 g 3  . lisinopril (PRINIVIL,ZESTRIL) 20 MG tablet Take 20 mg by mouth daily.      . ondansetron (ZOFRAN) 8 MG tablet Take 1 tablet (8 mg total) by mouth every 8 (eight) hours as needed for nausea or vomiting. 30 tablet 2  . polyethylene glycol (MIRALAX / GLYCOLAX) packet Take 17 g by mouth daily. Reported on 01/13/2016    . potassium chloride SA (K-DUR,KLOR-CON) 20 MEQ tablet Take 20 mEq by mouth daily. Takes 1/2 tablet daily    . prochlorperazine (COMPAZINE) 10 MG tablet Take 1 tablet (10 mg total) by mouth every 6 (six) hours as needed for nausea or vomiting. 30 tablet 2  . simvastatin (ZOCOR) 40  MG tablet     . triamterene-hydrochlorothiazide (MAXZIDE) 75-50 MG tablet      No current facility-administered medications for this visit.    PHYSICAL EXAMINATION: ECOG PERFORMANCE STATUS: 0 - Asymptomatic  Filed Vitals:   01/28/16 1238  BP: 149/58  Pulse: 81  Temp: 98.3 F (36.8 C)  Resp: 18   Filed Weights   01/28/16 1238  Weight: 138 lb 11.2 oz (62.914 kg)    GENERAL:alert, no distress and comfortable SKIN: skin color, texture, turgor are normal, no rashes or significant lesions EYES: normal, Conjunctiva are pink and non-injected, sclera clear OROPHARYNX:no exudate, no erythema and lips, buccal mucosa, and tongue normal  NECK: supple, thyroid normal size, non-tender, without nodularity LYMPH:  no palpable lymphadenopathy in the cervical, axillary or inguinal LUNGS: clear to auscultation and  percussion with normal breathing effort HEART: regular rate & rhythm and no murmurs and no lower extremity edema ABDOMEN:abdomen soft, non-tender and normal bowel sounds, rectal exam deferred. Musculoskeletal:no cyanosis of digits and no clubbing  NEURO: alert & oriented x 3 with fluent speech, no focal motor/sensory deficits  LABORATORY DATA:  I have reviewed the data as listed CBC Latest Ref Rng 01/28/2016 01/21/2016 01/16/2016  WBC 3.9 - 10.3 10e3/uL 3.3(L) 6.0 5.5  Hemoglobin 11.6 - 15.9 g/dL 10.1(L) 9.7(L) 10.4(L)  Hematocrit 34.8 - 46.6 % 31.9(L) 30.6(L) 32.7(L)  Platelets 145 - 400 10e3/uL 207 240 254     CMP Latest Ref Rng 01/21/2016 01/09/2016 12/20/2015  Glucose 65 - 99 mg/dL 115(H) 92 109(H)  BUN 6 - 20 mg/dL 21(H) 21(H) 11  Creatinine 0.44 - 1.00 mg/dL 1.02(H) 1.07(H) 0.94  Sodium 135 - 145 mmol/L 137 136 139  Potassium 3.5 - 5.1 mmol/L 3.8 4.3 3.5  Chloride 101 - 111 mmol/L 103 103 107  CO2 22 - 32 mmol/L 26 26 27   Calcium 8.9 - 10.3 mg/dL 9.5 10.0 9.0  Total Protein 6.5 - 8.1 g/dL 7.3 7.9 7.1  Total Bilirubin 0.3 - 1.2 mg/dL 0.6 0.6 0.4  Alkaline Phos 38 - 126 U/L 49 55 47  AST 15 - 41 U/L 17 21 19   ALT 14 - 54 U/L 10(L) 13(L) 10(L)   Pathology report  Diagnosis 12/20/2015 1. Colon, polyp(s), cecal and ascending - TUBULAR ADENOMA (2). NO HIGH GRADE DYSPLASIA OR MALIGNANCY IDENTIFIED. 2. Colon, polyp(s), descending/sigmoid - TUBULAR ADENOMA (4). NO HIGH GRADE DYSPLASIA OR MALIGNANCY IDENTIFIED. 3. Colon, polyp(s), sigmoid - TUBULAR ADENOMA. NO HIGH GRADE DYSPLASIA OR MALIGNANCY IDENTIFIED. 4. Rectum, biopsy, 18 cm from anal verge to 10 cm - ADENOCARCINOMA. - SEE MICROSCOPIC DESCRIPTION. Microscopic Comment 4. The biopsies are involved by invasive moderately differentiated colorectal adenocarcinoma.   RADIOGRAPHIC STUDIES: I have personally reviewed the radiological images as listed and agreed with the findings in the report. No results found.   ASSESSMENT &  PLAN:  82 year old female  1. Rectal cancer, moderately differentiated adenocarcinoma in proximal and mid rectum, cT3N0M0, stage IIA  -I reviewed her chart extensively and discussed with patient and her husband. -I agree with concurrent neoadjuvant chemoradiation for her stage II a disease.  -The goal of therapy is curative. -We reviewed potential side effects from chemotherapy and radiation -She is tolerating chemotherapy and radiation well, we'll continue -She has not seen a colorectal surgeon yet, I'll refer her to Dr. Marcello Moores or Dr. Johney Maine -Continue 5-FU infusion continuously, change chemotherapy back once a week. If she develops significant side effects, I would consider hold on chemotherapy on weekends -We'll monitor weekly  2. Iron deficient anemia -Her iron study is consistent with iron deficient anemia, secondary to GI bleeding from rectal cancer -She is on oral iron supplement, we'll continue -I'll repeat her iron study in a few weeks, will consider IV feraheme if needed   3. HTN and type 2 DM  -We'll continue her medication. -We'll monitor her blood pressure and blood glucose closely, we discussed that chemotherapy may affect her blood pressure and glucose.  Plan -continue 5-fu infusion with concurrent radiation -lab weekly -surgical referral -I will see her back in 2 weeks   All questions were answered. The patient knows to call the clinic with any problems, questions or concerns. No barriers to learning was detected.  I spent 30 minutes counseling the patient face to face. The total time spent in the appointment was 40 minutes and more than 50% was on counseling and review of test results     Truitt Merle, MD 01/28/2016

## 2016-01-28 NOTE — Progress Notes (Signed)
Patient going home on 7day pump of 22fu..  She and husband know to return on Monday July 3rd at 2:45 due to the July 4th holiday.  She will have pump disconnected and reconnected with 7days of 10fu on Monday.

## 2016-01-28 NOTE — Telephone Encounter (Signed)
per pof tos ch pt appt-sent MW email to sch trmt-will call pt after reply °

## 2016-01-28 NOTE — Progress Notes (Signed)
Dr. Burr Medico gave order for 5FU pump x 6 days but pump already made by pharmacy for 7 days. We will be closed for July 4th in 7 days.  Dr. Burr Medico ok if discard remaining portion of the 5FU pump when pt returns on 02/03/16 for pump exchange.   The amount that will infuse in 6 days is approx 2241 mg = 130 mL.  The portion that will be wasted will approx = 20 mL Kennith Center, Pharm.D., CPP 01/28/2016@2 :47 PM

## 2016-01-28 NOTE — Patient Instructions (Signed)
[facility] Discharge Instructions for Patients Receiving Chemotherapy  Today you received the following chemotherapy agents adrucil by a continuous infusion pump.  To help prevent nausea and vomiting after your treatment, we encourage you to take your nausea medication.   If you develop nausea and vomiting that is not controlled by your nausea medication, call the clinic.   BELOW ARE SYMPTOMS THAT SHOULD BE REPORTED IMMEDIATELY:  *FEVER GREATER THAN 100.5 F  *CHILLS WITH OR WITHOUT FEVER  NAUSEA AND VOMITING THAT IS NOT CONTROLLED WITH YOUR NAUSEA MEDICATION  *UNUSUAL SHORTNESS OF BREATH  *UNUSUAL BRUISING OR BLEEDING  TENDERNESS IN MOUTH AND THROAT WITH OR WITHOUT PRESENCE OF ULCERS  *URINARY PROBLEMS  *BOWEL PROBLEMS  UNUSUAL RASH Items with * indicate a potential emergency and should be followed up as soon as possible.  Feel free to call the clinic you have any questions or concerns. The clinic phone number is [phone].  Please show the [place] at check-in to the Emergency Department and triage nurse.

## 2016-01-28 NOTE — Addendum Note (Signed)
Encounter addended by: Heywood Footman, RN on: 01/28/2016 10:58 AM<BR>     Documentation filed: Notes Section, Inpatient Patient Education

## 2016-01-29 ENCOUNTER — Ambulatory Visit
Admission: RE | Admit: 2016-01-29 | Discharge: 2016-01-29 | Disposition: A | Discharge status: Home / Self Care | Payer: Medicare Other | Source: Ambulatory Visit | Attending: Radiation Oncology | Admitting: Radiation Oncology

## 2016-01-29 ENCOUNTER — Telehealth: Payer: Self-pay | Admitting: *Deleted

## 2016-01-29 DIAGNOSIS — Z510 Encounter for antineoplastic radiation therapy: Secondary | ICD-10-CM | POA: Diagnosis not present

## 2016-01-29 NOTE — Telephone Encounter (Signed)
Per staff message and POF I have scheduled appts. Advised scheduler of appts. JMW  

## 2016-01-30 ENCOUNTER — Encounter: Payer: Self-pay | Admitting: Radiation Oncology

## 2016-01-30 ENCOUNTER — Ambulatory Visit
Admission: RE | Admit: 2016-01-30 | Discharge: 2016-01-30 | Disposition: A | Discharge status: Home / Self Care | Payer: Medicare Other | Source: Ambulatory Visit | Attending: Radiation Oncology | Admitting: Radiation Oncology

## 2016-01-30 VITALS — BP 135/61 | HR 79 | Temp 98.2°F | Resp 16 | Wt 138.4 lb

## 2016-01-30 DIAGNOSIS — Z510 Encounter for antineoplastic radiation therapy: Secondary | ICD-10-CM | POA: Diagnosis not present

## 2016-01-30 DIAGNOSIS — C20 Malignant neoplasm of rectum: Secondary | ICD-10-CM

## 2016-01-30 NOTE — Progress Notes (Signed)
Weight and vitals stable. Denies pain. Continuous chemotherapy via power port noted. Denies fatigue. Reports an episodes of abdominal cramping once this week after eating a hamburger. Denies diarrhea or constipation. Denies rectal bleeding or irritation. Denies nausea or vomiting.   BP 135/61 mmHg  Pulse 79  Temp(Src) 98.2 F (36.8 C) (Oral)  Resp 16  Wt 138 lb 6.4 oz (62.778 kg)  SpO2 100% Wt Readings from Last 3 Encounters:  01/30/16 138 lb 6.4 oz (62.778 kg)  01/28/16 138 lb 11.2 oz (62.914 kg)  01/24/16 139 lb 12.8 oz (63.413 kg)

## 2016-01-30 NOTE — Progress Notes (Signed)
  Radiation Oncology         [phone]   Name: Cheyenne Morris MRN: [id number]   Date: 01/30/2016  DOB: 05-15-1931   Weekly Radiation Therapy Management    ICD-9-CM ICD-10-CM   1. Rectal cancer (HCC) 154.1 C20     Current Dose: 14.4 Gy  Planned Dose:  45 Gy  Narrative The patient presents for routine under treatment assessment.  Denies pain, rectal irritation, or fatigue. Reports one episode of nausea this week. Continuous chemotherapy noted, by the nurse, via power port. Reports drinking prune juice to manage constipation yesterday.  Set-up films were reviewed. The chart was checked.  Physical Findings  weight is 138 lb 6.4 oz (62.778 kg). Her oral temperature is 98.2 F (36.8 C). Her blood pressure is 135/61 and her pulse is 79. Her respiration is 16 and oxygen saturation is 100%. . Weight essentially stable.  No significant changes. Patient reports pain after eating a hamburger. She reports that rectal bleeding has stopped. Patient reports that skin around rectal area has a burning sensation.   Impression The patient is tolerating radiation.  Plan Continue treatment as planned.     Sheral Apley Tammi Klippel, M.D.     This document serves as a record of services personally performed by Tyler Pita, MD. It was created on his behalf by Truddie Hidden, a trained medical scribe. The creation of this record is based on the scribe's personal observations and the provider's statements to them. This document has been checked and approved by the attending provider.

## 2016-01-31 ENCOUNTER — Ambulatory Visit
Admission: RE | Admit: 2016-01-31 | Discharge: 2016-01-31 | Disposition: A | Discharge status: Home / Self Care | Payer: Medicare Other | Source: Ambulatory Visit | Attending: Radiation Oncology | Admitting: Radiation Oncology

## 2016-01-31 DIAGNOSIS — Z510 Encounter for antineoplastic radiation therapy: Secondary | ICD-10-CM | POA: Diagnosis not present

## 2016-02-02 ENCOUNTER — Encounter: Payer: Self-pay | Admitting: Hematology

## 2016-02-03 ENCOUNTER — Ambulatory Visit
Admission: RE | Admit: 2016-02-03 | Discharge: 2016-02-03 | Disposition: A | Discharge status: Home / Self Care | Payer: Medicare Other | Source: Ambulatory Visit | Attending: Radiation Oncology | Admitting: Radiation Oncology

## 2016-02-03 ENCOUNTER — Encounter: Payer: Self-pay | Admitting: Hematology

## 2016-02-03 ENCOUNTER — Other Ambulatory Visit (HOSPITAL_COMMUNITY): Payer: Medicare Other

## 2016-02-03 ENCOUNTER — Other Ambulatory Visit (HOSPITAL_BASED_OUTPATIENT_CLINIC_OR_DEPARTMENT_OTHER): Payer: Medicare Other

## 2016-02-03 ENCOUNTER — Other Ambulatory Visit: Payer: Self-pay | Admitting: Hematology

## 2016-02-03 ENCOUNTER — Ambulatory Visit (HOSPITAL_BASED_OUTPATIENT_CLINIC_OR_DEPARTMENT_OTHER): Payer: Medicare Other

## 2016-02-03 VITALS — BP 135/68 | HR 80 | Temp 98.6°F | Resp 17

## 2016-02-03 DIAGNOSIS — D509 Iron deficiency anemia, unspecified: Secondary | ICD-10-CM

## 2016-02-03 DIAGNOSIS — Z5111 Encounter for antineoplastic chemotherapy: Secondary | ICD-10-CM | POA: Diagnosis present

## 2016-02-03 DIAGNOSIS — C20 Malignant neoplasm of rectum: Secondary | ICD-10-CM

## 2016-02-03 DIAGNOSIS — Z510 Encounter for antineoplastic radiation therapy: Secondary | ICD-10-CM | POA: Diagnosis not present

## 2016-02-03 LAB — CBC WITH DIFFERENTIAL/PLATELET
BASO%: 0.2 % (ref 0.0–2.0)
Basophils Absolute: 0 10*3/uL (ref 0.0–0.1)
EOS%: 4.2 % (ref 0.0–7.0)
Eosinophils Absolute: 0.1 10*3/uL (ref 0.0–0.5)
HEMATOCRIT: 28.5 % — AB (ref 34.8–46.6)
HGB: 9.3 g/dL — ABNORMAL LOW (ref 11.6–15.9)
LYMPH#: 0.6 10*3/uL — AB (ref 0.9–3.3)
LYMPH%: 22.7 % (ref 14.0–49.7)
MCH: 27.8 pg (ref 25.1–34.0)
MCHC: 32.7 g/dL (ref 31.5–36.0)
MCV: 85.2 fL (ref 79.5–101.0)
MONO#: 0.1 10*3/uL (ref 0.1–0.9)
MONO%: 2.9 % (ref 0.0–14.0)
NEUT%: 70 % (ref 38.4–76.8)
NEUTROS ABS: 1.7 10*3/uL (ref 1.5–6.5)
PLATELETS: 203 10*3/uL (ref 145–400)
RBC: 3.34 10*6/uL — AB (ref 3.70–5.45)
RDW: 15.8 % — ABNORMAL HIGH (ref 11.2–14.5)
WBC: 2.5 10*3/uL — AB (ref 3.9–10.3)

## 2016-02-03 LAB — COMPREHENSIVE METABOLIC PANEL
ALT: 9 U/L (ref 0–55)
ANION GAP: 9 meq/L (ref 3–11)
AST: 15 U/L (ref 5–34)
Albumin: 3.6 g/dL (ref 3.5–5.0)
Alkaline Phosphatase: 51 U/L (ref 40–150)
BILIRUBIN TOTAL: 0.51 mg/dL (ref 0.20–1.20)
BUN: 19.8 mg/dL (ref 7.0–26.0)
CHLORIDE: 104 meq/L (ref 98–109)
CO2: 25 meq/L (ref 22–29)
CREATININE: 1.2 mg/dL — AB (ref 0.6–1.1)
Calcium: 9.6 mg/dL (ref 8.4–10.4)
EGFR: 51 mL/min/{1.73_m2} — ABNORMAL LOW (ref >90)
Glucose: 137 mg/dl (ref 70–140)
Potassium: 3.9 mEq/L (ref 3.5–5.1)
Sodium: 138 mEq/L (ref 136–145)
TOTAL PROTEIN: 7 g/dL (ref 6.4–8.3)

## 2016-02-03 LAB — IRON AND TIBC
%SAT: 38 % (ref 21–57)
IRON: 119 ug/dL (ref 41–142)
TIBC: 314 ug/dL (ref 236–444)
UIBC: 195 ug/dL (ref 120–384)

## 2016-02-03 LAB — FERRITIN: Ferritin: 42 ng/ml (ref 9–269)

## 2016-02-03 MED ORDER — SODIUM CHLORIDE 0.9 % IV SOLN
225.0000 mg/m2/d | INTRAVENOUS | Status: DC
  Administered 2016-02-03: 2650 mg via INTRAVENOUS
  Filled 2016-02-03: qty 53

## 2016-02-03 MED ORDER — SODIUM CHLORIDE 0.9% FLUSH
10.0000 mL | INTRAVENOUS | Status: DC | PRN
  Administered 2016-02-03: 10 mL
  Filled 2016-02-03: qty 10

## 2016-02-03 NOTE — Patient Instructions (Signed)
[facility] Discharge Instructions for Patients Receiving Chemotherapy  Today you received the following chemotherapy agents: Adrucil   To help prevent nausea and vomiting after your treatment, we encourage you to take your nausea medication as directed.    If you develop nausea and vomiting that is not controlled by your nausea medication, call the clinic.   BELOW ARE SYMPTOMS THAT SHOULD BE REPORTED IMMEDIATELY:  *FEVER GREATER THAN 100.5 F  *CHILLS WITH OR WITHOUT FEVER  NAUSEA AND VOMITING THAT IS NOT CONTROLLED WITH YOUR NAUSEA MEDICATION  *UNUSUAL SHORTNESS OF BREATH  *UNUSUAL BRUISING OR BLEEDING  TENDERNESS IN MOUTH AND THROAT WITH OR WITHOUT PRESENCE OF ULCERS  *URINARY PROBLEMS  *BOWEL PROBLEMS  UNUSUAL RASH Items with * indicate a potential emergency and should be followed up as soon as possible.  Feel free to call the clinic you have any questions or concerns. The clinic phone number is [phone].  Please show the CHEMO ALERT CARD at check-in to the Emergency Department and triage nurse.   

## 2016-02-03 NOTE — Progress Notes (Signed)
Introduced myself as pt's FA.  Pt has 2 insurances so copay assistance is not needed but I offered the [place] to assist w/ gas cards since she's coming everyday for radiation from [place].  Pt declined.  I gave her my card if she changes her mind.

## 2016-02-04 LAB — CEA: CEA1: 6.2 ng/mL — AB (ref 0.0–4.7)

## 2016-02-05 ENCOUNTER — Inpatient Hospital Stay (HOSPITAL_COMMUNITY): Payer: Medicare Other

## 2016-02-05 ENCOUNTER — Ambulatory Visit
Admission: RE | Admit: 2016-02-05 | Discharge: 2016-02-05 | Disposition: A | Discharge status: Home / Self Care | Payer: Medicare Other | Source: Ambulatory Visit | Attending: Radiation Oncology | Admitting: Radiation Oncology

## 2016-02-05 ENCOUNTER — Ambulatory Visit (HOSPITAL_COMMUNITY): Payer: Medicare Other | Admitting: Oncology

## 2016-02-05 DIAGNOSIS — Z510 Encounter for antineoplastic radiation therapy: Secondary | ICD-10-CM | POA: Diagnosis not present

## 2016-02-06 ENCOUNTER — Ambulatory Visit
Admission: RE | Admit: 2016-02-06 | Discharge: 2016-02-06 | Disposition: A | Discharge status: Home / Self Care | Payer: Medicare Other | Source: Ambulatory Visit | Attending: Radiation Oncology | Admitting: Radiation Oncology

## 2016-02-06 DIAGNOSIS — Z510 Encounter for antineoplastic radiation therapy: Secondary | ICD-10-CM | POA: Diagnosis not present

## 2016-02-07 ENCOUNTER — Encounter: Payer: Self-pay | Admitting: Radiation Oncology

## 2016-02-07 ENCOUNTER — Ambulatory Visit
Admission: RE | Admit: 2016-02-07 | Discharge: 2016-02-07 | Disposition: A | Discharge status: Home / Self Care | Payer: Medicare Other | Source: Ambulatory Visit | Attending: Radiation Oncology | Admitting: Radiation Oncology

## 2016-02-07 VITALS — BP 127/70 | HR 81 | Resp 16 | Wt 136.3 lb

## 2016-02-07 DIAGNOSIS — Z510 Encounter for antineoplastic radiation therapy: Secondary | ICD-10-CM | POA: Diagnosis not present

## 2016-02-07 DIAGNOSIS — C20 Malignant neoplasm of rectum: Secondary | ICD-10-CM

## 2016-02-07 NOTE — Progress Notes (Signed)
Department of Radiation Oncology  Phone:  [phone] Fax:        [phone]  Weekly Treatment Note    Name: Cheyenne Morris Date: 02/07/2016 MRN: [id number] DOB: 02-16-1931   Diagnosis:     ICD-9-CM ICD-10-CM   1. Rectal cancer (HCC) 154.1 C20      Current dose: 23.4 Gy  Current fraction:13   MEDICATIONS: Current Outpatient Prescriptions  Medication Sig Dispense Refill  . Calcium Carbonate-Vitamin D (CALCIUM-VITAMIN D) 600-200 MG-UNIT CAPS Take 1 tablet by mouth every morning.     . fluorouracil CALGB 16109 in sodium chloride 0.9 % 150 mL Inject into the vein. NOTE: this medication hasn't started yet    . glimepiride (AMARYL) 2 MG tablet Take 2 mg by mouth daily before breakfast.      . iron polysaccharides (NIFEREX) 150 MG capsule Take 1 capsule (150 mg total) by mouth daily. 30 capsule 2  . latanoprost (XALATAN) 0.005 % ophthalmic solution Place 1 drop into both eyes at bedtime.     . lidocaine-prilocaine (EMLA) cream Apply a quarter size amount to port site 1 hour prior to chemo. Do not rub in. Cover with plastic wrap. 30 g 3  . lisinopril (PRINIVIL,ZESTRIL) 20 MG tablet Take 20 mg by mouth daily.      . ondansetron (ZOFRAN) 8 MG tablet Take 1 tablet (8 mg total) by mouth every 8 (eight) hours as needed for nausea or vomiting. 30 tablet 2  . polyethylene glycol (MIRALAX / GLYCOLAX) packet Take 17 g by mouth daily. Reported on 01/13/2016    . potassium chloride SA (K-DUR,KLOR-CON) 20 MEQ tablet Take 20 mEq by mouth daily. Takes 1/2 tablet daily    . prochlorperazine (COMPAZINE) 10 MG tablet Take 1 tablet (10 mg total) by mouth every 6 (six) hours as needed for nausea or vomiting. 30 tablet 2  . simvastatin (ZOCOR) 40 MG tablet     . triamterene-hydrochlorothiazide (MAXZIDE) 75-50 MG tablet      No current facility-administered medications for this encounter.     ALLERGIES: Lime   LABORATORY DATA:  Lab Results  Component Value Date   WBC 2.5* 02/03/2016   HGB  9.3* 02/03/2016   HCT 28.5* 02/03/2016   MCV 85.2 02/03/2016   PLT 203 02/03/2016   Lab Results  Component Value Date   NA 138 02/03/2016   K 3.9 02/03/2016   CL 103 01/21/2016   CO2 25 02/03/2016   Lab Results  Component Value Date   ALT 9 02/03/2016   AST 15 02/03/2016   ALKPHOS 51 02/03/2016   BILITOT 0.51 02/03/2016     NARRATIVE: Cheyenne Morris was seen today for weekly treatment management. The chart was checked and the patient's films were reviewed.  Weight and vitals stable. Denies pain. Continuous chemotherapy via power port noted. Reports mild fatigue. Reports episode of indigestion yesterday which she managed with pepcid. Reports diarrhea. The first episode of diarrhea occurred the day before yesterday and is well managed with imodium. Reports rectal irritation. Discussed using baby wipes with aloe and tucks wipes to manage rectal irritation. Patient verbalized understanding. Denies nausea or vomiting.    PHYSICAL EXAMINATION: weight is 136 lb 4.8 oz (61.825 kg). Her blood pressure is 127/70 and her pulse is 81. Her respiration is 16 and oxygen saturation is 100%.        ASSESSMENT: The patient is doing satisfactorily with treatment.  PLAN: We will continue with the patient's radiation treatment as planned.      ------------------------------------------------  Jodelle Gross, MD, PhD  This document serves as a record of services personally performed by Kyung Rudd, MD. It was created on his behalf by Arlyce Harman, a trained medical scribe. The creation of this record is based on the scribe's personal observations and the provider's statements to them. This document has been checked and approved by the attending provider.

## 2016-02-07 NOTE — Progress Notes (Signed)
Weight and vitals stable. Denies pain. Continuous chemotherapy via power port noted. Reports mild fatigue. Reports episode of indigestion yesterday which she managed with pepcid. Reports diarrhea. Reports rectal irritation. Discussed using baby wipes with aloe and tucks wipes to manage rectal irritation. Patient verbalized understanding. Denies nausea or vomiting.   BP 127/70 mmHg  Pulse 81  Resp 16  Wt 136 lb 4.8 oz (61.825 kg)  SpO2 100% Wt Readings from Last 3 Encounters:  02/07/16 136 lb 4.8 oz (61.825 kg)  01/30/16 138 lb 6.4 oz (62.778 kg)  01/28/16 138 lb 11.2 oz (62.914 kg)

## 2016-02-10 ENCOUNTER — Other Ambulatory Visit (HOSPITAL_BASED_OUTPATIENT_CLINIC_OR_DEPARTMENT_OTHER): Payer: Medicare Other

## 2016-02-10 ENCOUNTER — Ambulatory Visit (HOSPITAL_BASED_OUTPATIENT_CLINIC_OR_DEPARTMENT_OTHER): Payer: Medicare Other | Admitting: Hematology

## 2016-02-10 ENCOUNTER — Ambulatory Visit
Admission: RE | Admit: 2016-02-10 | Discharge: 2016-02-10 | Disposition: A | Discharge status: Home / Self Care | Payer: Medicare Other | Source: Ambulatory Visit | Attending: Radiation Oncology | Admitting: Radiation Oncology

## 2016-02-10 ENCOUNTER — Ambulatory Visit (HOSPITAL_BASED_OUTPATIENT_CLINIC_OR_DEPARTMENT_OTHER): Payer: Medicare Other

## 2016-02-10 ENCOUNTER — Encounter: Payer: Self-pay | Admitting: Hematology

## 2016-02-10 VITALS — BP 130/57 | HR 78 | Temp 97.8°F | Resp 18 | Ht 62.0 in | Wt 134.9 lb

## 2016-02-10 DIAGNOSIS — D500 Iron deficiency anemia secondary to blood loss (chronic): Secondary | ICD-10-CM | POA: Diagnosis not present

## 2016-02-10 DIAGNOSIS — Z5111 Encounter for antineoplastic chemotherapy: Secondary | ICD-10-CM | POA: Diagnosis present

## 2016-02-10 DIAGNOSIS — I10 Essential (primary) hypertension: Secondary | ICD-10-CM | POA: Diagnosis not present

## 2016-02-10 DIAGNOSIS — C20 Malignant neoplasm of rectum: Secondary | ICD-10-CM | POA: Diagnosis present

## 2016-02-10 DIAGNOSIS — R630 Anorexia: Secondary | ICD-10-CM | POA: Diagnosis not present

## 2016-02-10 DIAGNOSIS — R634 Abnormal weight loss: Secondary | ICD-10-CM | POA: Diagnosis not present

## 2016-02-10 DIAGNOSIS — E119 Type 2 diabetes mellitus without complications: Secondary | ICD-10-CM | POA: Diagnosis not present

## 2016-02-10 DIAGNOSIS — D509 Iron deficiency anemia, unspecified: Secondary | ICD-10-CM

## 2016-02-10 DIAGNOSIS — Z510 Encounter for antineoplastic radiation therapy: Secondary | ICD-10-CM | POA: Diagnosis not present

## 2016-02-10 LAB — COMPREHENSIVE METABOLIC PANEL
ALBUMIN: 3.6 g/dL (ref 3.5–5.0)
ALK PHOS: 50 U/L (ref 40–150)
ALT: 11 U/L (ref 0–55)
ANION GAP: 9 meq/L (ref 3–11)
AST: 18 U/L (ref 5–34)
BILIRUBIN TOTAL: 0.84 mg/dL (ref 0.20–1.20)
BUN: 20.6 mg/dL (ref 7.0–26.0)
CO2: 23 mEq/L (ref 22–29)
Calcium: 9.4 mg/dL (ref 8.4–10.4)
Chloride: 103 mEq/L (ref 98–109)
Creatinine: 1.2 mg/dL — ABNORMAL HIGH (ref 0.6–1.1)
EGFR: 51 mL/min/{1.73_m2} — AB (ref >90)
Glucose: 100 mg/dl (ref 70–140)
POTASSIUM: 3.4 meq/L — AB (ref 3.5–5.1)
Sodium: 135 mEq/L — ABNORMAL LOW (ref 136–145)
TOTAL PROTEIN: 7.1 g/dL (ref 6.4–8.3)

## 2016-02-10 LAB — CBC WITH DIFFERENTIAL/PLATELET
BASO%: 0.3 % (ref 0.0–2.0)
BASOS ABS: 0 10*3/uL (ref 0.0–0.1)
EOS%: 9 % — AB (ref 0.0–7.0)
Eosinophils Absolute: 0.2 10*3/uL (ref 0.0–0.5)
HEMATOCRIT: 28.8 % — AB (ref 34.8–46.6)
HGB: 9.3 g/dL — ABNORMAL LOW (ref 11.6–15.9)
LYMPH#: 0.3 10*3/uL — AB (ref 0.9–3.3)
LYMPH%: 12 % — ABNORMAL LOW (ref 14.0–49.7)
MCH: 27.6 pg (ref 25.1–34.0)
MCHC: 32.3 g/dL (ref 31.5–36.0)
MCV: 85.5 fL (ref 79.5–101.0)
MONO#: 0.1 10*3/uL (ref 0.1–0.9)
MONO%: 2.2 % (ref 0.0–14.0)
NEUT#: 2 10*3/uL (ref 1.5–6.5)
NEUT%: 76.5 % (ref 38.4–76.8)
PLATELETS: 164 10*3/uL (ref 145–400)
RBC: 3.36 10*6/uL — ABNORMAL LOW (ref 3.70–5.45)
RDW: 15.9 % — ABNORMAL HIGH (ref 11.2–14.5)
WBC: 2.7 10*3/uL — ABNORMAL LOW (ref 3.9–10.3)

## 2016-02-10 MED ORDER — FLUOROURACIL CHEMO INJECTION 5 GM/100ML
225.0000 mg/m2/d | INTRAVENOUS | Status: DC
  Administered 2016-02-10: 2650 mg via INTRAVENOUS
  Filled 2016-02-10: qty 53

## 2016-02-10 MED ORDER — SODIUM CHLORIDE 0.9 % IV SOLN: Freq: Once | INTRAVENOUS | Status: DC

## 2016-02-10 MED ORDER — SODIUM CHLORIDE 0.9% FLUSH
10.0000 mL | INTRAVENOUS | Status: DC | PRN
  Filled 2016-02-10: qty 10

## 2016-02-10 NOTE — Progress Notes (Signed)
[facility]  Telephone:([phone] [phone]  Clinic Follow up Note   Patient Care Team: Lemmie Evens, MD as PCP - General (Family Medicine) Danie Binder, MD as Consulting Physician (Gastroenterology) 02/10/2016  SUMMARY OF ONCOLOGIC HISTORY:   Rectal cancer ([place])   12/20/2015 Tumor Marker CEA 10.3    12/20/2015 Pathology Results Biopsy c/w adenocarcinoma   12/20/2015 Procedure Colonoscopy with Dr. Oneida Alar with partially obstructing mass extending from proximal to mid rectum, 10 cm from anal verge   12/23/2015 Imaging CT abdomen/pelvis rectosigmoid wall thickening two 4 mm perirectal LN, No evidence metastatic disease   01/02/2016 Procedure EUS with Dr. Ardis Hughs nearly circumferential mass distal edge 7 cm from anal verge, No perirectal adenopathy [id number]   01/16/2016 Procedure Port placed by IR   01/21/2016 -  Chemotherapy 5FU continuous infusion days 1-7 with XRT.     01/21/2016 -  Radiation Therapy Tyler Pita, [place]   HISTORY OF PRESENTING ILLNESS (Dr. Whitney Muse, 01/09/2016):  Cheyenne Morris 82 y.o. female is here because of newly diagnosed adenocarcinoma of the rectum. She comes to the [facility] today accompanied by several family members, her husband Cheyenne Morris, two daughters, and granddaughter. [name] appears very fit and young for her age.  She saw a little blood in her stool, starting about a year ago. She had also lost a little weight, saying "it just fell off." She had had a colonoscopy before, and went to see Dr. Oneida Alar for another when she noticed the blood in her stool. She was told to go get the colonoscopy after noticing the symptoms. She'd only ever had one polyp removed during a colonoscopy before, and had "8" removed during this most recent one.  After the colonoscopy, she was told that they found cancer. At that time, [name] had to go see Dr. Ardis Hughs in [place]. He did pre-treatment staging studies for her rectal cancer, and made the  appointment to come here to [facility]. After this, she notes that she had to go down to [place]. She spoke to a lady there. She says that, in [place], "they just weighed me and talked to me about the cancer." She cannot remember the name of the female doctor she spoke with.  After an extensive overview of her cancer and treatment trajectory, one of her daughters asks "are you okay?" and [name] notes "I'm okay." Her daughters were advised that they can come and attend other appointments to remain on top of everything.  [name] notes that she doesn't have any rectal pain at present. She notes that sometimes it will pain, especially if she's been sitting a long time, but not often.  Still regularly receives mammograms.  During the physical exam, she denies any chest pain, and denies shortness of breath. She also denies any abdominal pain. She notes that she is still having some bleeding in her stool, "sometimes, not all the time."  She says she has planted a garden this year, with tomatoes, cucumbers, and squash. She is still active and able to do what she wants without difficulty.  She notes she eats. At the end of her appointment, she says "I'm hungry!" and notes that she wants some potatoes and gravy. She then says "I'm not that hungry; I just wanted to laugh!" She notes that her mood is optimistic, she has a good support system.   CURRENT THERAPY: concurrent chemotherapy and radiaiton, 5-fu 225mg /m2/day continuously, started on 01/21/2016   INTERVAL HISTORY: [name] returns for follow  up. She is tolerating concurrent chemoradiation moderately well, her main complaint is mild to moderate fatigue and decreased appetite. She eats small meals, has not tried any nutritional supplement. She lost 3 pounds in the past week. She had diarrhea for 2 days last weekend, resolved on its own. No fever or chills. No muscle, skin rash no other complaints.  REVIEW OF SYSTEMS:     Constitutional: Denies fevers, chills. (+) fatigue and weight loss  Eyes: Denies blurriness of vision Ears, nose, mouth, throat, and face: Denies mucositis or sore throat Respiratory: Denies cough, dyspnea or wheezes Cardiovascular: Denies palpitation, chest discomfort or lower extremity swelling Gastrointestinal:  Denies nausea, heartburn or change in bowel habits Skin: Denies abnormal skin rashes Lymphatics: Denies new lymphadenopathy or easy bruising Neurological:Denies numbness, tingling or new weaknesses Behavioral/Psych: Mood is stable, no new changes  All other systems were reviewed with the patient and are negative.  MEDICAL HISTORY:  Past Medical History  Diagnosis Date  . Diabetes mellitus   . Hypertension   . Rectal cancer [facility])     SURGICAL HISTORY: Past Surgical History  Procedure Laterality Date  . Colonoscopy    . Cataract extraction, bilateral    . Colonoscopy  01/05/2012    Dr Jenkins:Sessile polyp in the ascending colon/Normal colonoscopy otherwise, path tubular adenoma  . Colonoscopy N/A 12/20/2015    Procedure: COLONOSCOPY;  Surgeon: Danie Binder, MD;  Location: [facility];  Service: Endoscopy;  Laterality: N/A;  0930  . Esophagogastroduodenoscopy N/A 12/20/2015    Procedure: ESOPHAGOGASTRODUODENOSCOPY (EGD);  Surgeon: Danie Binder, MD;  Location: [facility];  Service: Endoscopy;  Laterality: N/A;  . Eus N/A 01/02/2016    Procedure: LOWER ENDOSCOPIC ULTRASOUND (EUS);  Surgeon: [name], MD;  Location: [facility];  Service: Endoscopy;  Laterality: N/A;    I have reviewed the social history and family history with the patient and they are unchanged from previous note.  ALLERGIES:  is allergic to lime.  MEDICATIONS:  Current Outpatient Prescriptions  Medication Sig Dispense Refill  . Calcium Carbonate-Vitamin D (CALCIUM-VITAMIN D) 600-200 MG-UNIT CAPS Take 1 tablet by mouth every morning.     . fluorouracil CALGB 60454 in sodium chloride 0.9  % 150 mL Inject into the vein. NOTE: this medication hasn't started yet    . glimepiride (AMARYL) 2 MG tablet Take 2 mg by mouth daily before breakfast.      . iron polysaccharides (NIFEREX) 150 MG capsule Take 1 capsule (150 mg total) by mouth daily. 30 capsule 2  . latanoprost (XALATAN) 0.005 % ophthalmic solution Place 1 drop into both eyes at bedtime.     . lidocaine-prilocaine (EMLA) cream Apply a quarter size amount to port site 1 hour prior to chemo. Do not rub in. Cover with plastic wrap. 30 g 3  . lisinopril (PRINIVIL,ZESTRIL) 20 MG tablet Take 20 mg by mouth daily.      . ondansetron (ZOFRAN) 8 MG tablet Take 1 tablet (8 mg total) by mouth every 8 (eight) hours as needed for nausea or vomiting. 30 tablet 2  . polyethylene glycol (MIRALAX / GLYCOLAX) packet Take 17 g by mouth daily. Reported on 01/13/2016    . potassium chloride SA (K-DUR,KLOR-CON) 20 MEQ tablet Take 20 mEq by mouth daily. Takes 1/2 tablet daily    . prochlorperazine (COMPAZINE) 10 MG tablet Take 1 tablet (10 mg total) by mouth every 6 (six) hours as needed for nausea or vomiting. 30 tablet 2  . simvastatin (ZOCOR)  40 MG tablet     . triamterene-hydrochlorothiazide (MAXZIDE) 75-50 MG tablet      No current facility-administered medications for this visit.    PHYSICAL EXAMINATION: ECOG PERFORMANCE STATUS: 1  Filed Vitals:   02/10/16 1409  BP: 130/57  Pulse: 78  Temp: 97.8 F (36.6 C)  Resp: 18   Filed Weights   02/10/16 1409  Weight: 134 lb 14.4 oz (61.19 kg)    GENERAL:alert, no distress and comfortable SKIN: skin color, texture, turgor are normal, no rashes or significant lesions EYES: normal, Conjunctiva are pink and non-injected, sclera clear OROPHARYNX:no exudate, no erythema and lips, buccal mucosa, and tongue normal  NECK: supple, thyroid normal size, non-tender, without nodularity LYMPH:  no palpable lymphadenopathy in the cervical, axillary or inguinal LUNGS: clear to auscultation and percussion  with normal breathing effort HEART: regular rate & rhythm and no murmurs and no lower extremity edema ABDOMEN:abdomen soft, non-tender and normal bowel sounds, rectal exam deferred. Musculoskeletal:no cyanosis of digits and no clubbing  NEURO: alert & oriented x 3 with fluent speech, no focal motor/sensory deficits  LABORATORY DATA:  I have reviewed the data as listed CBC Latest Ref Rng 02/10/2016 02/03/2016 01/28/2016  WBC 3.9 - 10.3 10e3/uL 2.7(L) 2.5(L) 3.3(L)  Hemoglobin 11.6 - 15.9 g/dL 9.3(L) 9.3(L) 10.1(L)  Hematocrit 34.8 - 46.6 % 28.8(L) 28.5(L) 31.9(L)  Platelets 145 - 400 10e3/uL 164 203 207     CMP Latest Ref Rng 02/10/2016 02/03/2016 01/28/2016  Glucose 70 - 140 mg/dl 100 137 95  BUN 7.0 - 26.0 mg/dL 20.6 19.8 19.3  Creatinine 0.6 - 1.1 mg/dL 1.2(H) 1.2(H) 1.0  Sodium 136 - 145 mEq/L 135(L) 138 136  Potassium 3.5 - 5.1 mEq/L 3.4(L) 3.9 3.9  Chloride 101 - 111 mmol/L - - -  CO2 22 - 29 mEq/L 23 25 24   Calcium 8.4 - 10.4 mg/dL 9.4 9.6 9.5  Total Protein 6.4 - 8.3 g/dL 7.1 7.0 7.2  Total Bilirubin 0.20 - 1.20 mg/dL 0.84 0.51 0.60  Alkaline Phos 40 - 150 U/L 50 51 58  AST 5 - 34 U/L 18 15 16   ALT 0 - 55 U/L 11 9 13    Results for KILIAN, BIESCHKE (MRN [id number]) as of 02/10/2016 22:26  Ref. Range 01/09/2016 16:20 02/03/2016 14:06  Iron Latest Ref Range: 41-142 ug/dL 24 (L) 119  UIBC Latest Ref Range: 120-384 ug/dL 400 195  TIBC Latest Ref Range: 236-444 ug/dL 424 314  %SAT Latest Ref Range: 21-57 %  38  Saturation Ratios Latest Ref Range: 10.4-31.8 % 6 (L)   Ferritin Latest Ref Range: 9-269 ng/ml 6 (L) 42   Pathology report  Diagnosis 12/20/2015 1. Colon, polyp(s), cecal and ascending - TUBULAR ADENOMA (2). NO HIGH GRADE DYSPLASIA OR MALIGNANCY IDENTIFIED. 2. Colon, polyp(s), descending/sigmoid - TUBULAR ADENOMA (4). NO HIGH GRADE DYSPLASIA OR MALIGNANCY IDENTIFIED. 3. Colon, polyp(s), sigmoid - TUBULAR ADENOMA. NO HIGH GRADE DYSPLASIA OR MALIGNANCY IDENTIFIED. 4. Rectum,  biopsy, 18 cm from anal verge to 10 cm - ADENOCARCINOMA. - SEE MICROSCOPIC DESCRIPTION. Microscopic Comment 4. The biopsies are involved by invasive moderately differentiated colorectal adenocarcinoma.   RADIOGRAPHIC STUDIES: I have personally reviewed the radiological images as listed and agreed with the findings in the report. No results found.   ASSESSMENT & PLAN:  82 year old female  1. Rectal cancer, moderately differentiated adenocarcinoma in proximal and mid rectum, cT3N0M0, stage IIA  -She is on neoadjuvant concurrent chemotherapy and radiation.  -The goal of therapy is curative. -We reviewed potential  side effects from chemotherapy and radiation -She is tolerating chemotherapy and radiation moderately well, we'll continue -She has not seen a colorectal surgeon yet, I'll refer her to Dr. Marcello Moores or Dr. Johney Maine -Continue 5-FU infusion continuously, change chemotherapy bag once a week. If she develops significant side effects, I would consider hold on chemotherapy on weekends -We'll monitor lab weekly  2. Iron deficient anemia -Her iron study is consistent with iron deficient anemia, secondary to GI bleeding from rectal cancer -She is on oral iron supplement, we'll continue -repeated iron study on 02/03/16 were normal  -Mild anemia is stable  3. HTN and type 2 DM  -We'll continue her medication. -We'll monitor her blood pressure and blood glucose closely, we discussed that chemotherapy may affect her blood pressure and glucose.  4. Anorexia and weight loss -I encouraged her to try nutrition supplement, such as Ensure or Boost, she agrees -Follow up with dietitian  Plan -continue 5-fu infusion with concurrent radiation -lab weekly -I will see her back in 2 weeks, the last day of her chemotherapy and radiation   All questions were answered. The patient knows to call the clinic with any problems, questions or concerns. No barriers to learning was detected.  I spent 20  minutes counseling the patient face to face. The total time spent in the appointment was 30 minutes and more than 50% was on counseling and review of test results     Truitt Merle, MD 02/10/2016

## 2016-02-10 NOTE — Patient Instructions (Signed)
[facility] Discharge Instructions for Patients Receiving Chemotherapy  Today you received the following chemotherapy agents: Adrucil   To help prevent nausea and vomiting after your treatment, we encourage you to take your nausea medication as directed.    If you develop nausea and vomiting that is not controlled by your nausea medication, call the clinic.   BELOW ARE SYMPTOMS THAT SHOULD BE REPORTED IMMEDIATELY:  *FEVER GREATER THAN 100.5 F  *CHILLS WITH OR WITHOUT FEVER  NAUSEA AND VOMITING THAT IS NOT CONTROLLED WITH YOUR NAUSEA MEDICATION  *UNUSUAL SHORTNESS OF BREATH  *UNUSUAL BRUISING OR BLEEDING  TENDERNESS IN MOUTH AND THROAT WITH OR WITHOUT PRESENCE OF ULCERS  *URINARY PROBLEMS  *BOWEL PROBLEMS  UNUSUAL RASH Items with * indicate a potential emergency and should be followed up as soon as possible.  Feel free to call the clinic you have any questions or concerns. The clinic phone number is [phone].  Please show the CHEMO ALERT CARD at check-in to the Emergency Department and triage nurse.   

## 2016-02-11 ENCOUNTER — Other Ambulatory Visit: Payer: Medicare Other

## 2016-02-11 ENCOUNTER — Ambulatory Visit
Admission: RE | Admit: 2016-02-11 | Discharge: 2016-02-11 | Disposition: A | Discharge status: Home / Self Care | Payer: Medicare Other | Source: Ambulatory Visit | Attending: Radiation Oncology | Admitting: Radiation Oncology

## 2016-02-11 ENCOUNTER — Ambulatory Visit: Payer: Medicare Other | Admitting: Hematology

## 2016-02-11 DIAGNOSIS — Z510 Encounter for antineoplastic radiation therapy: Secondary | ICD-10-CM | POA: Diagnosis not present

## 2016-02-12 ENCOUNTER — Ambulatory Visit
Admission: RE | Admit: 2016-02-12 | Discharge: 2016-02-12 | Disposition: A | Discharge status: Home / Self Care | Payer: Medicare Other | Source: Ambulatory Visit | Attending: Radiation Oncology | Admitting: Radiation Oncology

## 2016-02-12 ENCOUNTER — Inpatient Hospital Stay (HOSPITAL_COMMUNITY): Payer: Medicare Other

## 2016-02-12 ENCOUNTER — Other Ambulatory Visit: Payer: Self-pay | Admitting: *Deleted

## 2016-02-12 ENCOUNTER — Telehealth: Payer: Self-pay | Admitting: Hematology

## 2016-02-12 ENCOUNTER — Ambulatory Visit (HOSPITAL_COMMUNITY): Payer: Medicare Other | Admitting: Hematology & Oncology

## 2016-02-12 DIAGNOSIS — Z510 Encounter for antineoplastic radiation therapy: Secondary | ICD-10-CM | POA: Diagnosis not present

## 2016-02-12 NOTE — Telephone Encounter (Signed)
ADDED [facility] PER 7/12 POF. LEFT MESSAGE FOR PATIENT RE NEXT APPOINTMENT FOR 7/17 @ 10:30 AM. ADDED COMMENT TO 7/13 APPOINTMENT TO SEND PATIENT TO MED ONC FOR NEW SCHEDULE.

## 2016-02-13 ENCOUNTER — Ambulatory Visit
Admission: RE | Admit: 2016-02-13 | Discharge: 2016-02-13 | Disposition: A | Discharge status: Home / Self Care | Payer: Medicare Other | Source: Ambulatory Visit | Attending: Radiation Oncology | Admitting: Radiation Oncology

## 2016-02-13 ENCOUNTER — Encounter: Payer: Self-pay | Admitting: Radiation Oncology

## 2016-02-13 VITALS — BP 112/53 | HR 79 | Resp 16 | Wt 131.7 lb

## 2016-02-13 DIAGNOSIS — Z510 Encounter for antineoplastic radiation therapy: Secondary | ICD-10-CM | POA: Diagnosis not present

## 2016-02-13 DIAGNOSIS — C20 Malignant neoplasm of rectum: Secondary | ICD-10-CM

## 2016-02-13 NOTE — Progress Notes (Signed)
  Radiation Oncology         [id number]   Name: Cheyenne Morris MRN: [id number]   Date: 02/13/2016  DOB: [birth year]   Weekly Radiation Therapy Management    ICD-9-CM ICD-10-CM   1. Rectal cancer (HCC) 154.1 C20     Current Dose: 30.6 Gy  Planned Dose:  45 Gy  Narrative The patient presents for routine under treatment assessment.  Vitals stable. Three pound weight loss noted. Denies pain. Continuous chemotherapy continues. Reports last night following dinner she experience epigastric pain which resolved after she took prilosec and tums. Reports diarrhea last week but, a "normal" formed bowel movement. Reports rectal pain with bowel movements. Denies blood in stool. Denies any rectal pressure. Denies nausea or vomiting.   Set-up films were reviewed. The chart was checked.  Physical Findings  weight is 131 lb 11.2 oz (59.739 kg). Her blood pressure is 112/53 and her pulse is 79. Her respiration is 16 and oxygen saturation is 100%. .  No significant changes.   Impression The patient is tolerating radiation.  Plan Continue treatment as planned.     Sheral Apley Tammi Klippel, M.D.     This document serves as a record of services personally performed by Tyler Pita, MD. It was created on his behalf by Truddie Hidden, a trained medical scribe. The creation of this record is based on the scribe's personal observations and the provider's statements to them. This document has been checked and approved by the attending provider.

## 2016-02-13 NOTE — Progress Notes (Signed)
Vitals stable. Three pound weight loss noted.  Denies pain. Continuous chemotherapy continues. Reports last night following dinner she experience epigastric pain which resolved after she took prilosec and tums. Reports diarrhea last week but, a "normal" formed bowel movement. Reports rectal pain with bowel movements. Denies blood in stool. Denies any rectal pressure. Denies nausea or vomiting.   BP 112/53 mmHg  Pulse 79  Resp 16  Wt 131 lb 11.2 oz (59.739 kg)  SpO2 100% Wt Readings from Last 3 Encounters:  02/13/16 131 lb 11.2 oz (59.739 kg)  02/10/16 134 lb 14.4 oz (61.19 kg)  02/07/16 136 lb 4.8 oz (61.825 kg)

## 2016-02-14 ENCOUNTER — Ambulatory Visit
Admission: RE | Admit: 2016-02-14 | Discharge: 2016-02-14 | Disposition: A | Discharge status: Home / Self Care | Payer: Medicare Other | Source: Ambulatory Visit | Attending: Radiation Oncology | Admitting: Radiation Oncology

## 2016-02-14 DIAGNOSIS — Z510 Encounter for antineoplastic radiation therapy: Secondary | ICD-10-CM | POA: Diagnosis not present

## 2016-02-17 ENCOUNTER — Ambulatory Visit (HOSPITAL_BASED_OUTPATIENT_CLINIC_OR_DEPARTMENT_OTHER): Payer: Medicare Other | Admitting: Nurse Practitioner

## 2016-02-17 ENCOUNTER — Ambulatory Visit (HOSPITAL_BASED_OUTPATIENT_CLINIC_OR_DEPARTMENT_OTHER): Payer: Medicare Other

## 2016-02-17 ENCOUNTER — Other Ambulatory Visit (HOSPITAL_BASED_OUTPATIENT_CLINIC_OR_DEPARTMENT_OTHER): Payer: Medicare Other

## 2016-02-17 ENCOUNTER — Ambulatory Visit
Admission: RE | Admit: 2016-02-17 | Discharge: 2016-02-17 | Disposition: A | Discharge status: Home / Self Care | Payer: Medicare Other | Source: Ambulatory Visit | Attending: Radiation Oncology | Admitting: Radiation Oncology

## 2016-02-17 ENCOUNTER — Other Ambulatory Visit: Payer: Medicare Other

## 2016-02-17 VITALS — BP 130/78 | HR 88 | Temp 98.8°F | Resp 16 | Wt 126.8 lb

## 2016-02-17 DIAGNOSIS — K1230 Oral mucositis (ulcerative), unspecified: Secondary | ICD-10-CM

## 2016-02-17 DIAGNOSIS — E46 Unspecified protein-calorie malnutrition: Secondary | ICD-10-CM | POA: Diagnosis not present

## 2016-02-17 DIAGNOSIS — K649 Unspecified hemorrhoids: Secondary | ICD-10-CM | POA: Diagnosis not present

## 2016-02-17 DIAGNOSIS — N289 Disorder of kidney and ureter, unspecified: Secondary | ICD-10-CM | POA: Diagnosis not present

## 2016-02-17 DIAGNOSIS — Z5111 Encounter for antineoplastic chemotherapy: Secondary | ICD-10-CM | POA: Diagnosis present

## 2016-02-17 DIAGNOSIS — C20 Malignant neoplasm of rectum: Secondary | ICD-10-CM

## 2016-02-17 DIAGNOSIS — E860 Dehydration: Secondary | ICD-10-CM

## 2016-02-17 DIAGNOSIS — E876 Hypokalemia: Secondary | ICD-10-CM | POA: Diagnosis not present

## 2016-02-17 DIAGNOSIS — D701 Agranulocytosis secondary to cancer chemotherapy: Secondary | ICD-10-CM

## 2016-02-17 DIAGNOSIS — R634 Abnormal weight loss: Secondary | ICD-10-CM

## 2016-02-17 DIAGNOSIS — R197 Diarrhea, unspecified: Secondary | ICD-10-CM

## 2016-02-17 DIAGNOSIS — K1231 Oral mucositis (ulcerative) due to antineoplastic therapy: Secondary | ICD-10-CM

## 2016-02-17 DIAGNOSIS — R110 Nausea: Secondary | ICD-10-CM | POA: Diagnosis not present

## 2016-02-17 DIAGNOSIS — K529 Noninfective gastroenteritis and colitis, unspecified: Secondary | ICD-10-CM

## 2016-02-17 DIAGNOSIS — Z510 Encounter for antineoplastic radiation therapy: Secondary | ICD-10-CM | POA: Diagnosis not present

## 2016-02-17 DIAGNOSIS — T451X5A Adverse effect of antineoplastic and immunosuppressive drugs, initial encounter: Secondary | ICD-10-CM

## 2016-02-17 LAB — CBC WITH DIFFERENTIAL/PLATELET
BASO%: 0.1 % (ref 0.0–2.0)
BASOS ABS: 0 10*3/uL (ref 0.0–0.1)
EOS ABS: 0 10*3/uL (ref 0.0–0.5)
EOS%: 1 % (ref 0.0–7.0)
HCT: 29.5 % — ABNORMAL LOW (ref 34.8–46.6)
HEMOGLOBIN: 9.8 g/dL — AB (ref 11.6–15.9)
LYMPH%: 7.3 % — AB (ref 14.0–49.7)
MCH: 27.7 pg (ref 25.1–34.0)
MCHC: 33.1 g/dL (ref 31.5–36.0)
MCV: 83.9 fL (ref 79.5–101.0)
MONO#: 0 10*3/uL — ABNORMAL LOW (ref 0.1–0.9)
MONO%: 3.5 % (ref 0.0–14.0)
NEUT%: 88.1 % — ABNORMAL HIGH (ref 38.4–76.8)
NEUTROS ABS: 1.1 10*3/uL — AB (ref 1.5–6.5)
Platelets: 184 10*3/uL (ref 145–400)
RBC: 3.51 10*6/uL — AB (ref 3.70–5.45)
RDW: 15.9 % — AB (ref 11.2–14.5)
WBC: 1.2 10*3/uL — AB (ref 3.9–10.3)
lymph#: 0.1 10*3/uL — ABNORMAL LOW (ref 0.9–3.3)

## 2016-02-17 LAB — COMPREHENSIVE METABOLIC PANEL
ALT: 11 U/L (ref 0–55)
AST: 16 U/L (ref 5–34)
Albumin: 3.5 g/dL (ref 3.5–5.0)
Alkaline Phosphatase: 41 U/L (ref 40–150)
Anion Gap: 10 mEq/L (ref 3–11)
BUN: 32.4 mg/dL — AB (ref 7.0–26.0)
CO2: 24 meq/L (ref 22–29)
Calcium: 9.3 mg/dL (ref 8.4–10.4)
Chloride: 102 mEq/L (ref 98–109)
Creatinine: 1.3 mg/dL — ABNORMAL HIGH (ref 0.6–1.1)
EGFR: 46 mL/min/{1.73_m2} — AB (ref >90)
GLUCOSE: 137 mg/dL (ref 70–140)
POTASSIUM: 3.2 meq/L — AB (ref 3.5–5.1)
SODIUM: 136 meq/L (ref 136–145)
Total Bilirubin: 0.82 mg/dL (ref 0.20–1.20)
Total Protein: 6.7 g/dL (ref 6.4–8.3)

## 2016-02-17 MED ORDER — SODIUM CHLORIDE 0.9 % IV SOLN
180.0000 mg/m2/d | INTRAVENOUS | Status: DC
  Administered 2016-02-17: 2100 mg via INTRAVENOUS
  Filled 2016-02-17: qty 42

## 2016-02-17 MED ORDER — MAGIC MOUTHWASH W/LIDOCAINE: 5.0000 mL | Freq: Four times a day (QID) | ORAL | Status: AC | PRN

## 2016-02-17 MED ORDER — HEPARIN SOD (PORK) LOCK FLUSH 100 UNIT/ML IV SOLN
500.0000 [IU] | Freq: Once | INTRAVENOUS | Status: DC | PRN
  Filled 2016-02-17: qty 5

## 2016-02-17 MED ORDER — SODIUM CHLORIDE 0.9 % IV SOLN: Freq: Once | INTRAVENOUS | Status: DC

## 2016-02-17 MED ORDER — FLUCONAZOLE 100 MG PO TABS: 100.0000 mg | ORAL_TABLET | Freq: Every day | ORAL | Status: AC

## 2016-02-17 MED ORDER — SODIUM CHLORIDE 0.9% FLUSH
10.0000 mL | INTRAVENOUS | Status: DC | PRN
  Filled 2016-02-17: qty 10

## 2016-02-17 MED FILL — MAGIC MOUTHWASH W/LIDO 1:1: 12 days supply | Qty: 240 | Fill #0

## 2016-02-17 MED FILL — FLUCONAZOLE 100 MG TABLET: 100 | 14 days supply | Qty: 14 | Fill #0

## 2016-02-17 NOTE — Progress Notes (Signed)
Patient presented to the clinic reporting diarrhea despite medication. Explains Dr. Burr Medico encouraged her to take an OTC medication to manage her diarrhea but, its no longer working. Patient is uncertain of the name of the medication but, believes it may be Imodium. Patient is scheduled to return today for labs and infusion. Encouraged patient to bring all of her medication to this appointment and let Dr. Burr Medico know the medication is no longer working. Provided patient with DIARRHEA MANAGEMENT worksheet and reviewed information. Patient verbalized understanding of foods best to avoid when experiencing diarrhea. Patient verbalized understanding of all reviewed/discussed.

## 2016-02-17 NOTE — Progress Notes (Signed)
Patient complains of diarrhea up to 3 watery stools per day, requiring depends to avoid accident, wakes patient during night.  Has taken imodium without improvement.  Able to tolerate fluids but smell of food causes nausea.  Reports mouth sores and sores on creases of lips.  CBC shows neutropenic.  Symptom management consulted for possible evaluation prior to set up of 5FU pump.  Patient reports weight loss of 5 lbs

## 2016-02-17 NOTE — Patient Instructions (Signed)
[facility] Discharge Instructions for Patients Receiving Chemotherapy  Today you received the following chemotherapy agents:  5FU.  To help prevent nausea and vomiting after your treatment, we encourage you to take your nausea medication as directed.   If you develop nausea and vomiting that is not controlled by your nausea medication, call the clinic.   BELOW ARE SYMPTOMS THAT SHOULD BE REPORTED IMMEDIATELY:  *FEVER GREATER THAN 100.5 F  *CHILLS WITH OR WITHOUT FEVER  NAUSEA AND VOMITING THAT IS NOT CONTROLLED WITH YOUR NAUSEA MEDICATION  *UNUSUAL SHORTNESS OF BREATH  *UNUSUAL BRUISING OR BLEEDING  TENDERNESS IN MOUTH AND THROAT WITH OR WITHOUT PRESENCE OF ULCERS  *URINARY PROBLEMS  *BOWEL PROBLEMS  UNUSUAL RASH Items with * indicate a potential emergency and should be followed up as soon as possible.  Feel free to call the clinic you have any questions or concerns. The clinic phone number is [phone].  Please show the CHEMO ALERT CARD at check-in to the Emergency Department and triage nurse.   

## 2016-02-18 ENCOUNTER — Ambulatory Visit (HOSPITAL_BASED_OUTPATIENT_CLINIC_OR_DEPARTMENT_OTHER): Payer: Medicare Other | Admitting: Nurse Practitioner

## 2016-02-18 ENCOUNTER — Ambulatory Visit
Admission: RE | Admit: 2016-02-18 | Discharge: 2016-02-18 | Disposition: A | Discharge status: Home / Self Care | Payer: Medicare Other | Source: Ambulatory Visit | Attending: Radiation Oncology | Admitting: Radiation Oncology

## 2016-02-18 ENCOUNTER — Ambulatory Visit (HOSPITAL_BASED_OUTPATIENT_CLINIC_OR_DEPARTMENT_OTHER): Payer: Self-pay

## 2016-02-18 ENCOUNTER — Encounter: Payer: Self-pay | Admitting: Nurse Practitioner

## 2016-02-18 ENCOUNTER — Encounter: Payer: Self-pay | Admitting: *Deleted

## 2016-02-18 ENCOUNTER — Other Ambulatory Visit: Payer: Self-pay | Admitting: Nurse Practitioner

## 2016-02-18 VITALS — BP 147/71 | HR 100 | Temp 98.5°F | Resp 18 | Ht 62.0 in | Wt 128.5 lb

## 2016-02-18 VITALS — BP 138/71 | HR 92 | Temp 98.8°F | Resp 18

## 2016-02-18 DIAGNOSIS — E860 Dehydration: Secondary | ICD-10-CM

## 2016-02-18 DIAGNOSIS — R197 Diarrhea, unspecified: Secondary | ICD-10-CM

## 2016-02-18 DIAGNOSIS — K649 Unspecified hemorrhoids: Secondary | ICD-10-CM

## 2016-02-18 DIAGNOSIS — K1231 Oral mucositis (ulcerative) due to antineoplastic therapy: Secondary | ICD-10-CM

## 2016-02-18 DIAGNOSIS — T451X5A Adverse effect of antineoplastic and immunosuppressive drugs, initial encounter: Secondary | ICD-10-CM

## 2016-02-18 DIAGNOSIS — R110 Nausea: Secondary | ICD-10-CM | POA: Diagnosis not present

## 2016-02-18 DIAGNOSIS — C20 Malignant neoplasm of rectum: Secondary | ICD-10-CM | POA: Diagnosis present

## 2016-02-18 DIAGNOSIS — Z95828 Presence of other vascular implants and grafts: Secondary | ICD-10-CM | POA: Insufficient documentation

## 2016-02-18 DIAGNOSIS — K521 Toxic gastroenteritis and colitis: Secondary | ICD-10-CM

## 2016-02-18 DIAGNOSIS — E876 Hypokalemia: Secondary | ICD-10-CM | POA: Diagnosis not present

## 2016-02-18 DIAGNOSIS — N289 Disorder of kidney and ureter, unspecified: Secondary | ICD-10-CM | POA: Insufficient documentation

## 2016-02-18 DIAGNOSIS — D701 Agranulocytosis secondary to cancer chemotherapy: Secondary | ICD-10-CM | POA: Insufficient documentation

## 2016-02-18 DIAGNOSIS — K1230 Oral mucositis (ulcerative), unspecified: Secondary | ICD-10-CM

## 2016-02-18 DIAGNOSIS — R634 Abnormal weight loss: Secondary | ICD-10-CM | POA: Insufficient documentation

## 2016-02-18 MED ORDER — SODIUM CHLORIDE 0.9 % IJ SOLN
10.0000 mL | INTRAMUSCULAR | Status: DC | PRN
  Filled 2016-02-18: qty 10

## 2016-02-18 MED ORDER — SODIUM CHLORIDE 0.9 % IJ SOLN
10.0000 mL | INTRAMUSCULAR | Status: DC | PRN
  Administered 2016-02-18: 10 mL via INTRAVENOUS
  Filled 2016-02-18: qty 10

## 2016-02-18 MED ORDER — SODIUM CHLORIDE 0.9 % IV SOLN
1880.0000 mg | Freq: Once | INTRAVENOUS | Status: DC
  Administered 2016-02-18: 1900 mg via INTRAVENOUS
  Filled 2016-02-18: qty 38

## 2016-02-18 MED ORDER — HEPARIN SOD (PORK) LOCK FLUSH 100 UNIT/ML IV SOLN
500.0000 [IU] | Freq: Once | INTRAVENOUS | Status: DC | PRN
  Filled 2016-02-18: qty 5

## 2016-02-18 MED ORDER — SODIUM CHLORIDE 0.9 % IV SOLN
1000.0000 mL | Freq: Once | INTRAVENOUS | Status: AC
  Administered 2016-02-18: 1000 mL via INTRAVENOUS

## 2016-02-18 NOTE — Addendum Note (Signed)
Addended by: Clifton James D on: 02/18/2016 01:03 PM   Modules accepted: Orders

## 2016-02-18 NOTE — Progress Notes (Signed)
Went in to [employer], LPN with reconnecting pt's continuous infusion. 5FU line was not capped, no longer sterile. Pt will need another chemo bag and line to connect to central line. Discussed with Lattie Haw, PharmD: pharmacy will need information on how much has infused and is left to infuse. Discussed with Charge RN in infusion, pt will be worked in for pump re-connection.

## 2016-02-18 NOTE — Assessment & Plan Note (Signed)
Blood counts obtained today reveal a WBC 1.2, ANC 1.1, hemoglobin 9.8, and platelet count 184.  Briefly reviewed all neutropenia guidelines with the patient and her husband today.  Also, will decrease the 5FU pump in hopes of decreasing the side effects of nausea, diarrhea, and mucositis.  We'll continue to monitor closely.

## 2016-02-18 NOTE — Assessment & Plan Note (Signed)
Patient states that she is taking potassium tablets 20 mg per day while she continues with chronic diarrhea.  She understands the instructions to decrease the potassium down to 10 mEq per day.  Once the diarrhea has cleared.

## 2016-02-18 NOTE — Assessment & Plan Note (Signed)
Patient has a history of chronic renal insufficiency.  Patient's creatinine has increased to 1.3 today.  Most likely this is secondary to mild dehydration.  Will continue to monitor closely.

## 2016-02-18 NOTE — Progress Notes (Signed)
Pt to have pump changed/replaced. RN obtained + bld return from port, 5 FU administered via pump. VSS, no complaints. Verbalized understanding of plan of care for today and when to return for pump d/c.

## 2016-02-18 NOTE — Patient Instructions (Signed)

## 2016-02-18 NOTE — Assessment & Plan Note (Signed)
Patient has been experiencing chronic nausea, and new-onset diarrhea for the past few days.  She feels dehydrated today.  She also states she has had decreased appetite and intake.  She has been losing weight recently.  Patient was advised to continue to push protein in her diet and eat multiple small meals throughout the day.

## 2016-02-18 NOTE — Assessment & Plan Note (Signed)
Patient is complaining of some chronic nausea; but no vomiting recently.  Most likely this is secondary to her chemotherapy.  She has anti--nausea medications to take at home on an as-needed basis.

## 2016-02-18 NOTE — Assessment & Plan Note (Signed)
Patient presented to the [facility] yesterday to receive her final cycle 5 5FU pump.  She also continues to receive daily radiation treatments.  Her final radiation treatment is scheduled for 02/25/2016.  Patient reports chronic nausea, new onset diarrhea since this past weekend, and significant mucositis/thrush issues.  She feels dehydrated today as well.  Blood counts obtained yesterday revealed a mild neutropenia with ANC 1.1.  Vital signs were stable and patient was afebrile.  Reviewed all findings with Dr. Burr Medico; and she has suggested that we decrease the 5FU pump dosage somewhat to help alleviate the symptoms.  Patient is scheduled to return on Monday, 02/24/2016 for labs, visit, and her next cycle of chemotherapy.

## 2016-02-18 NOTE — Assessment & Plan Note (Signed)
Chemotherapy-induced mucositis noted today; with thrush as well.  Patient states that her mouth.  Discomfort has caused her to have decreased oral intake.  Patient was prescribed Diflucan tablets 100 mg to take on a daily basis; as well as Magic mouthwash with lidocaine and nystatin.  Also, will plan to decreased patient's 5 FU pump dosage somewhat to see if this helps with mucositis issues as well as diarrhea.

## 2016-02-18 NOTE — Assessment & Plan Note (Addendum)
Potassium is decreased down to 3.2 today.  Patient states she continues to take 10 mEq on a daily basis.  Most likely, hypokalemia secondary to new onset diarrhea.   Patient was advised to increase to 20 mEq, potassium daily while she is having diarrhea.  Once the diarrhea has resolved-patient can go back to the potassium half a tablet (10 meq) per day as previously directed.

## 2016-02-18 NOTE — Assessment & Plan Note (Signed)
Patient presented to the [facility] today to receive her final cycle 5 5FU pump.  She also continues to receive daily radiation treatments.  Her final radiation treatment is scheduled for 02/25/2016.  Patient reports chronic nausea, new onset diarrhea since this past weekend, and significant mucositis/thrush issues.  She feels dehydrated today as well.  Blood counts obtained today reveal a mild neutropenia with ANC 1.1.  Vital signs were stable and patient was afebrile.  Reviewed all findings with Dr. Burr Medico; and she has suggested that we decrease the 5FU pump dosage somewhat to help alleviate the symptoms.  Patient has plans to return to the [facility] tomorrow to receive IV fluid rehydration.  Also, patient is scheduled to return on Monday, 02/24/2016 for labs, visit, and her next cycle of chemotherapy.

## 2016-02-18 NOTE — Assessment & Plan Note (Signed)
Patient states that she has developed chronic diarrhea since past weekend.  She has also been nauseous and feels dehydrated as well.  Patient has only intermittently taken some Imodium.  She was encouraged to take Imodium 2 tablets every loose stool up to maximum of 8 tablets per day.  Also, advised patient we may want to try prescription for Lomotil if the Imodium does not work.

## 2016-02-18 NOTE — Progress Notes (Signed)
SYMPTOM MANAGEMENT CLINIC    Chief Complaint: Nausea, diarrhea, mucositis, dehydration  HPI:  Cheyenne Morris 82 y.o. female diagnosed with rectal cancer.  Currently undergoing 5 FU pump and radiation treatments.    Rectal cancer ([place])   12/20/2015 Tumor Marker CEA 10.3    12/20/2015 Pathology Results Biopsy c/w adenocarcinoma   12/20/2015 Procedure Colonoscopy with Dr. Oneida Alar with partially obstructing mass extending from proximal to mid rectum, 10 cm from anal verge   12/23/2015 Imaging CT abdomen/pelvis rectosigmoid wall thickening two 4 mm perirectal LN, No evidence metastatic disease   01/02/2016 Procedure EUS with Dr. Ardis Hughs nearly circumferential mass distal edge 7 cm from anal verge, No perirectal adenopathy [id number]   01/16/2016 Procedure Port placed by IR   01/21/2016 -  Chemotherapy 5FU continuous infusion days 1-7 with XRT.     01/21/2016 -  Radiation Therapy [facility], [place]    Review of Systems  Constitutional: Positive for weight loss and malaise/fatigue. Negative for fever and chills.  HENT:       Complain of mucositis and thrush.  Gastrointestinal: Positive for nausea, abdominal pain, diarrhea and blood in stool. Negative for vomiting.       History of hemorrhoids  All other systems reviewed and are negative.   Past Medical History  Diagnosis Date  . Diabetes mellitus   . Hypertension   . Rectal cancer [facility])     Past Surgical History  Procedure Laterality Date  . Colonoscopy    . Cataract extraction, bilateral    . Colonoscopy  01/05/2012    Dr Jenkins:Sessile polyp in the ascending colon/Normal colonoscopy otherwise, path tubular adenoma  . Colonoscopy N/A 12/20/2015    Procedure: COLONOSCOPY;  Surgeon: Danie Binder, MD;  Location: [facility];  Service: Endoscopy;  Laterality: N/A;  0930  . Esophagogastroduodenoscopy N/A 12/20/2015    Procedure: ESOPHAGOGASTRODUODENOSCOPY (EGD);  Surgeon: Danie Binder, MD;  Location: [facility];  Service: Endoscopy;   Laterality: N/A;  . Eus N/A 01/02/2016    Procedure: LOWER ENDOSCOPIC ULTRASOUND (EUS);  Surgeon: [name], MD;  Location: [facility];  Service: Endoscopy;  Laterality: N/A;    has Iron deficiency anemia; Rectal cancer ([place]); Diarrhea; Dehydration; Mucositis; Hypokalemia; Hemorrhoids; Unintentional weight loss; Nausea without vomiting; Renal insufficiency; Chemotherapy induced neutropenia ([place]); and Port catheter in place on her problem list.    is allergic to lime.    Medication List       This list is accurate as of: 02/17/16 11:59 PM.  Always use your most recent med list.               calcium carbonate 500 MG chewable tablet  Commonly known as:  TUMS - dosed in mg elemental calcium  Chew 1 tablet by mouth daily.     Calcium-Vitamin D 600-200 MG-UNIT Caps  Take 1 tablet by mouth every morning.     fluconazole 100 MG tablet  Commonly known as:  DIFLUCAN  Take 1 tablet (100 mg total) by mouth daily.     fluorouracil CALGB 15056 in sodium chloride 0.9 % 150 mL  Inject into the vein. NOTE: this medication hasn't started yet     glimepiride 2 MG tablet  Commonly known as:  AMARYL  Take 2 mg by mouth daily before breakfast.     iron polysaccharides 150 MG capsule  Commonly known as:  NIFEREX  Take 1 capsule (150 mg total) by mouth daily.     latanoprost 0.005 %  ophthalmic solution  Commonly known as:  XALATAN  Place 1 drop into both eyes at bedtime.     lidocaine-prilocaine cream  Commonly known as:  EMLA  Apply a quarter size amount to port site 1 hour prior to chemo. Do not rub in. Cover with plastic wrap.     lisinopril 20 MG tablet  Commonly known as:  PRINIVIL,ZESTRIL  Take 20 mg by mouth daily.     magic mouthwash w/lidocaine Soln  Take 5 mLs by mouth 4 (four) times daily as needed for mouth pain ([employer] formula w/ nystatin and lidocaine 1:1:1.).     omeprazole 10 MG capsule  Commonly known as:  PRILOSEC  Take 10 mg by mouth daily.     ondansetron  8 MG tablet  Commonly known as:  ZOFRAN  Take 1 tablet (8 mg total) by mouth every 8 (eight) hours as needed for nausea or vomiting.     polyethylene glycol packet  Commonly known as:  MIRALAX / GLYCOLAX  Take 17 g by mouth daily. Reported on 01/13/2016     potassium chloride SA 20 MEQ tablet  Commonly known as:  K-DUR,KLOR-CON  Take 20 mEq by mouth daily. Takes 1/2 tablet daily     prochlorperazine 10 MG tablet  Commonly known as:  COMPAZINE  Take 1 tablet (10 mg total) by mouth every 6 (six) hours as needed for nausea or vomiting.     simvastatin 40 MG tablet  Commonly known as:  ZOCOR     triamterene-hydrochlorothiazide 75-50 MG tablet  Commonly known as:  MAXZIDE         PHYSICAL EXAMINATION  Oncology Vitals 02/18/2016 02/17/2016  Height 158 cm -  Weight 58.287 kg 57.516 kg  Weight (lbs) 128 lbs 8 oz 126 lbs 13 oz  BMI (kg/m2) 23.5 kg/m2 -  Temp 98.5 98.8  Pulse 100 88  Resp 18 16  SpO2 100 100  BSA (m2) 1.6 m2 -   BP Readings from Last 2 Encounters:  02/18/16 147/71  02/17/16 130/78    Physical Exam  Constitutional: She is oriented to person, place, and time. Vital signs are normal. She appears dehydrated. She appears unhealthy.  HENT:  Head: Normocephalic and atraumatic.  Patient with obvious thrush to entire oral mucosa and tongue.  Eyes: Conjunctivae and EOM are normal. Pupils are equal, round, and reactive to light. Right eye exhibits no discharge. Left eye exhibits no discharge. No scleral icterus.  Neck: Normal range of motion. Neck supple. No JVD present. No tracheal deviation present. No thyromegaly present.  Cardiovascular: Normal rate, regular rhythm, normal heart sounds and intact distal pulses.   Pulmonary/Chest: Breath sounds normal. No respiratory distress. She has no wheezes. She has no rales. She exhibits no tenderness.  Abdominal: Soft. Bowel sounds are normal. She exhibits no distension and no mass. There is no tenderness. There is no rebound  and no guarding.  Musculoskeletal: Normal range of motion. She exhibits no edema or tenderness.  Lymphadenopathy:    She has no cervical adenopathy.  Neurological: She is alert and oriented to person, place, and time. Gait normal.  Skin: Skin is warm and dry. No rash noted. No erythema. There is pallor.  Psychiatric: Affect normal.  Nursing note and vitals reviewed.   LABORATORY DATA:. Appointment on 02/17/2016  Component Date Value Ref Range Status  . WBC 02/17/2016 1.2* 3.9 - 10.3 10e3/uL Final  . NEUT# 02/17/2016 1.1* 1.5 - 6.5 10e3/uL Final  . HGB 02/17/2016 9.8* 11.6 -  15.9 g/dL Final  . HCT 02/17/2016 29.5* 34.8 - 46.6 % Final  . Platelets 02/17/2016 184  145 - 400 10e3/uL Final  . MCV 02/17/2016 83.9  79.5 - 101.0 fL Final  . MCH 02/17/2016 27.7  25.1 - 34.0 pg Final  . MCHC 02/17/2016 33.1  31.5 - 36.0 g/dL Final  . RBC 02/17/2016 3.51* 3.70 - 5.45 10e6/uL Final  . RDW 02/17/2016 15.9* 11.2 - 14.5 % Final  . lymph# 02/17/2016 0.1* 0.9 - 3.3 10e3/uL Final  . MONO# 02/17/2016 0.0* 0.1 - 0.9 10e3/uL Final  . Eosinophils Absolute 02/17/2016 0.0  0.0 - 0.5 10e3/uL Final  . Basophils Absolute 02/17/2016 0.0  0.0 - 0.1 10e3/uL Final  . NEUT% 02/17/2016 88.1* 38.4 - 76.8 % Final  . LYMPH% 02/17/2016 7.3* 14.0 - 49.7 % Final  . MONO% 02/17/2016 3.5  0.0 - 14.0 % Final  . EOS% 02/17/2016 1.0  0.0 - 7.0 % Final  . BASO% 02/17/2016 0.1  0.0 - 2.0 % Final  . Sodium 02/17/2016 136  136 - 145 mEq/L Final  . Potassium 02/17/2016 3.2* 3.5 - 5.1 mEq/L Final  . Chloride 02/17/2016 102  98 - 109 mEq/L Final  . CO2 02/17/2016 24  22 - 29 mEq/L Final  . Glucose 02/17/2016 137  70 - 140 mg/dl Final   Glucose reference range is for nonfasting patients. Fasting glucose reference range is 70- 100.  Marland Kitchen BUN 02/17/2016 32.4* 7.0 - 26.0 mg/dL Final  . Creatinine 02/17/2016 1.3* 0.6 - 1.1 mg/dL Final  . Total Bilirubin 02/17/2016 0.82  0.20 - 1.20 mg/dL Final  . Alkaline Phosphatase 02/17/2016 41   40 - 150 U/L Final  . AST 02/17/2016 16  5 - 34 U/L Final  . ALT 02/17/2016 11  0 - 55 U/L Final  . Total Protein 02/17/2016 6.7  6.4 - 8.3 g/dL Final  . Albumin 02/17/2016 3.5  3.5 - 5.0 g/dL Final  . Calcium 02/17/2016 9.3  8.4 - 10.4 mg/dL Final  . Anion Gap 02/17/2016 10  3 - 11 mEq/L Final  . EGFR 02/17/2016 46* >90 ml/min/1.73 m2 Final   eGFR is calculated using the CKD-EPI Creatinine Equation (2009)    RADIOGRAPHIC STUDIES: No results found.  ASSESSMENT/PLAN:    Unintentional weight loss Patient has been experiencing chronic nausea, and new-onset diarrhea for the past few days.  She feels dehydrated today.  She also states she has had decreased appetite and intake.  She has been losing weight recently.  Patient was advised to continue to push protein in her diet and eat multiple small meals throughout the day.  Renal insufficiency Patient has a history of chronic renal insufficiency.  Patient's creatinine has increased to 1.3 today.  Most likely this is secondary to mild dehydration.  Will continue to monitor closely.  Nausea without vomiting Patient is complaining of some chronic nausea; but no vomiting recently.  Most likely this is secondary to her chemotherapy.  She has anti--nausea medications to take at home on an as-needed basis.  Mucositis Chemotherapy-induced mucositis noted today; with thrush as well.  Patient states that her mouth.  Discomfort has caused her to have decreased oral intake.  Patient was prescribed Diflucan tablets 100 mg to take on a daily basis; as well as Magic mouthwash with lidocaine and nystatin.  Also, will plan to decreased patient's 5 FU pump dosage somewhat to see if this helps with mucositis issues as well as diarrhea.  Hypokalemia Potassium is decreased down  to 3.2 today.  Patient states she continues to take 10 mEq on a daily basis.  Most likely, hypokalemia secondary to new onset diarrhea.   Patient was advised to increase to 20 mEq,  potassium daily while she is having diarrhea.  Once the diarrhea has resolved-patient can go back to the potassium half a tablet (10 meq) per day as previously directed.            Hemorrhoids Patient states that she has had chronic diarrhea since this past weekend.  She has a history of chronic hemorrhoids as well.  She states that the chronic diarrhea has irritated the hemorrhoids; and she has been noticing some bright red blood when she wipes secondary to hemorrhoids.  Patient states she's only tried over-the-counter baby cream and baby wipes to the hemorrhoids recently.  Advised patient she could try the over-the-counter Preparation H cream to the hemorrhoids as well.  Patient was advised to call/return or go directly to the emergency department if she develops any worsening rectal bleeding whatsoever.  Diarrhea Patient states that she has developed chronic diarrhea since past weekend.  She has also been nauseous and feels dehydrated as well.  Patient has only intermittently taken some Imodium.  She was encouraged to take Imodium 2 tablets every loose stool up to maximum of 8 tablets per day.  Also, advised patient we may want to try prescription for Lomotil if the Imodium does not work.    Dehydration Patient has been suffering with chronic nausea; but no vomiting.  She's also had new onset diarrhea since this past weekend.  She's had poor intake and feels dehydrated today.  Due to the late hour the day-patient will return first thing in the morning to receive IV fluid rehydration.  Chemotherapy induced neutropenia ([facility]) Blood counts obtained today reveal a WBC 1.2, ANC 1.1, hemoglobin 9.8, and platelet count 184.  Briefly reviewed all neutropenia guidelines with the patient and her husband today.  Also, will decrease the 5FU pump in hopes of decreasing the side effects of nausea, diarrhea, and mucositis.  We'll continue to monitor closely.  Rectal cancer [facility]) Patient presented to the  [facility] today to receive her final cycle 5 5FU pump.  She also continues to receive daily radiation treatments.  Her final radiation treatment is scheduled for 02/25/2016.  Patient reports chronic nausea, new onset diarrhea since this past weekend, and significant mucositis/thrush issues.  She feels dehydrated today as well.  Blood counts obtained today reveal a mild neutropenia with ANC 1.1.  Vital signs were stable and patient was afebrile.  Reviewed all findings with Dr. Burr Medico; and she has suggested that we decrease the 5FU pump dosage somewhat to help alleviate the symptoms.  Patient has plans to return to the [facility] tomorrow to receive IV fluid rehydration.  Also, patient is scheduled to return on Monday, 02/24/2016 for labs, visit, and her next cycle of chemotherapy.    Patient stated understanding of all instructions; and was in agreement with this plan of care. The patient knows to call the clinic with any problems, questions or concerns.   Total time spent with patient was 40 minutes;  with greater than 75 percent of that time spent in face to face counseling regarding patient's symptoms,  and coordination of care and follow up.  Disclaimer:This dictation was prepared with Dragon/digital dictation along with [employer]. Any transcriptional errors that result from this process are unintentional.  Drue Second, NP 02/18/2016

## 2016-02-18 NOTE — Assessment & Plan Note (Signed)
Patient has been suffering with chronic nausea; but no vomiting.  She's also had new onset diarrhea since this past weekend.  She's had poor intake and feels dehydrated today.  Due to the late hour the day-patient will return first thing in the morning to receive IV fluid rehydration.

## 2016-02-18 NOTE — Progress Notes (Signed)
SYMPTOM MANAGEMENT CLINIC    Chief Complaint: Nausea, diarrhea, mucositis, dehydration  HPI:  Cheyenne Morris 82 y.o. female diagnosed with rectal cancer.  Currently undergoing 5 FU pump and radiation treatments.    Rectal cancer ([place])   12/20/2015 Tumor Marker CEA 10.3    12/20/2015 Pathology Results Biopsy c/w adenocarcinoma   12/20/2015 Procedure Colonoscopy with Dr. Oneida Alar with partially obstructing mass extending from proximal to mid rectum, 10 cm from anal verge   12/23/2015 Imaging CT abdomen/pelvis rectosigmoid wall thickening two 4 mm perirectal LN, No evidence metastatic disease   01/02/2016 Procedure EUS with Dr. Ardis Hughs nearly circumferential mass distal edge 7 cm from anal verge, No perirectal adenopathy [id number]   01/16/2016 Procedure Port placed by IR   01/21/2016 -  Chemotherapy 5FU continuous infusion days 1-7 with XRT.     01/21/2016 -  Radiation Therapy Tyler Pita, [place]    Review of Systems  Constitutional: Positive for weight loss and malaise/fatigue. Negative for fever and chills.  HENT:       Complain of mucositis and thrush.  Gastrointestinal: Positive for nausea, abdominal pain, diarrhea and blood in stool. Negative for vomiting.       History of hemorrhoids  All other systems reviewed and are negative.   Past Medical History  Diagnosis Date  . Diabetes mellitus   . Hypertension   . Rectal cancer [facility])     Past Surgical History  Procedure Laterality Date  . Colonoscopy    . Cataract extraction, bilateral    . Colonoscopy  01/05/2012    Dr Jenkins:Sessile polyp in the ascending colon/Normal colonoscopy otherwise, path tubular adenoma  . Colonoscopy N/A 12/20/2015    Procedure: COLONOSCOPY;  Surgeon: Danie Binder, MD;  Location: [facility];  Service: Endoscopy;  Laterality: N/A;  0930  . Esophagogastroduodenoscopy N/A 12/20/2015    Procedure: ESOPHAGOGASTRODUODENOSCOPY (EGD);  Surgeon: Danie Binder, MD;  Location: [facility];  Service: Endoscopy;   Laterality: N/A;  . Eus N/A 01/02/2016    Procedure: LOWER ENDOSCOPIC ULTRASOUND (EUS);  Surgeon: [name], MD;  Location: [facility];  Service: Endoscopy;  Laterality: N/A;    has Iron deficiency anemia; Rectal cancer ([place]); Diarrhea; Dehydration; Mucositis; Hypokalemia; Hemorrhoids; Unintentional weight loss; Nausea without vomiting; Renal insufficiency; and Chemotherapy induced neutropenia (HCC) on her problem list.    is allergic to lime.    Medication List       This list is accurate as of: 02/18/16 10:46 AM.  Always use your most recent med list.               calcium carbonate 500 MG chewable tablet  Commonly known as:  TUMS - dosed in mg elemental calcium  Chew 1 tablet by mouth daily.     Calcium-Vitamin D 600-200 MG-UNIT Caps  Take 1 tablet by mouth every morning.     fluconazole 100 MG tablet  Commonly known as:  DIFLUCAN  Take 1 tablet (100 mg total) by mouth daily.     fluorouracil CALGB 09735 in sodium chloride 0.9 % 150 mL  Inject into the vein. NOTE: this medication hasn't started yet     glimepiride 2 MG tablet  Commonly known as:  AMARYL  Take 2 mg by mouth daily before breakfast.     iron polysaccharides 150 MG capsule  Commonly known as:  NIFEREX  Take 1 capsule (150 mg total) by mouth daily.     latanoprost 0.005 % ophthalmic solution  Commonly  known as:  XALATAN  Place 1 drop into both eyes at bedtime.     lidocaine-prilocaine cream  Commonly known as:  EMLA  Apply a quarter size amount to port site 1 hour prior to chemo. Do not rub in. Cover with plastic wrap.     lisinopril 20 MG tablet  Commonly known as:  PRINIVIL,ZESTRIL  Take 20 mg by mouth daily.     magic mouthwash w/lidocaine Soln  Take 5 mLs by mouth 4 (four) times daily as needed for mouth pain ([employer] formula w/ nystatin and lidocaine 1:1:1.).     omeprazole 10 MG capsule  Commonly known as:  PRILOSEC  Take 10 mg by mouth daily.     ondansetron 8 MG tablet  Commonly  known as:  ZOFRAN  Take 1 tablet (8 mg total) by mouth every 8 (eight) hours as needed for nausea or vomiting.     polyethylene glycol packet  Commonly known as:  MIRALAX / GLYCOLAX  Take 17 g by mouth daily. Reported on 01/13/2016     potassium chloride SA 20 MEQ tablet  Commonly known as:  K-DUR,KLOR-CON  Take 20 mEq by mouth daily. Takes 1/2 tablet daily     prochlorperazine 10 MG tablet  Commonly known as:  COMPAZINE  Take 1 tablet (10 mg total) by mouth every 6 (six) hours as needed for nausea or vomiting.     simvastatin 40 MG tablet  Commonly known as:  ZOCOR     triamterene-hydrochlorothiazide 75-50 MG tablet  Commonly known as:  MAXZIDE         PHYSICAL EXAMINATION  Oncology Vitals 02/18/2016 02/17/2016  Height 158 cm -  Weight 58.287 kg 57.516 kg  Weight (lbs) 128 lbs 8 oz 126 lbs 13 oz  BMI (kg/m2) 23.5 kg/m2 -  Temp 98.5 98.8  Pulse 100 88  Resp 18 16  SpO2 100 100  BSA (m2) 1.6 m2 -   BP Readings from Last 2 Encounters:  02/18/16 147/71  02/17/16 130/78    Physical Exam  Constitutional: She is oriented to person, place, and time. Vital signs are normal. She appears dehydrated. She appears unhealthy.  HENT:  Head: Normocephalic and atraumatic.  Patient with obvious thrush to entire oral mucosa and tongue.  Eyes: Conjunctivae and EOM are normal. Pupils are equal, round, and reactive to light. Right eye exhibits no discharge. Left eye exhibits no discharge. No scleral icterus.  Neck: Normal range of motion. Neck supple. No JVD present. No tracheal deviation present. No thyromegaly present.  Cardiovascular: Normal rate, regular rhythm, normal heart sounds and intact distal pulses.   Pulmonary/Chest: Breath sounds normal. No respiratory distress. She has no wheezes. She has no rales. She exhibits no tenderness.  Abdominal: Soft. Bowel sounds are normal. She exhibits no distension and no mass. There is no tenderness. There is no rebound and no guarding.    Musculoskeletal: Normal range of motion. She exhibits no edema or tenderness.  Lymphadenopathy:    She has no cervical adenopathy.  Neurological: She is alert and oriented to person, place, and time. Gait normal.  Skin: Skin is warm and dry. No rash noted. No erythema. There is pallor.  Psychiatric: Affect normal.  Nursing note and vitals reviewed.   LABORATORY DATA:. Appointment on 02/17/2016  Component Date Value Ref Range Status  . WBC 02/17/2016 1.2* 3.9 - 10.3 10e3/uL Final  . NEUT# 02/17/2016 1.1* 1.5 - 6.5 10e3/uL Final  . HGB 02/17/2016 9.8* 11.6 - 15.9 g/dL Final  .  HCT 02/17/2016 29.5* 34.8 - 46.6 % Final  . Platelets 02/17/2016 184  145 - 400 10e3/uL Final  . MCV 02/17/2016 83.9  79.5 - 101.0 fL Final  . MCH 02/17/2016 27.7  25.1 - 34.0 pg Final  . MCHC 02/17/2016 33.1  31.5 - 36.0 g/dL Final  . RBC 02/17/2016 3.51* 3.70 - 5.45 10e6/uL Final  . RDW 02/17/2016 15.9* 11.2 - 14.5 % Final  . lymph# 02/17/2016 0.1* 0.9 - 3.3 10e3/uL Final  . MONO# 02/17/2016 0.0* 0.1 - 0.9 10e3/uL Final  . Eosinophils Absolute 02/17/2016 0.0  0.0 - 0.5 10e3/uL Final  . Basophils Absolute 02/17/2016 0.0  0.0 - 0.1 10e3/uL Final  . NEUT% 02/17/2016 88.1* 38.4 - 76.8 % Final  . LYMPH% 02/17/2016 7.3* 14.0 - 49.7 % Final  . MONO% 02/17/2016 3.5  0.0 - 14.0 % Final  . EOS% 02/17/2016 1.0  0.0 - 7.0 % Final  . BASO% 02/17/2016 0.1  0.0 - 2.0 % Final  . Sodium 02/17/2016 136  136 - 145 mEq/L Final  . Potassium 02/17/2016 3.2* 3.5 - 5.1 mEq/L Final  . Chloride 02/17/2016 102  98 - 109 mEq/L Final  . CO2 02/17/2016 24  22 - 29 mEq/L Final  . Glucose 02/17/2016 137  70 - 140 mg/dl Final   Glucose reference range is for nonfasting patients. Fasting glucose reference range is 70- 100.  Marland Kitchen BUN 02/17/2016 32.4* 7.0 - 26.0 mg/dL Final  . Creatinine 02/17/2016 1.3* 0.6 - 1.1 mg/dL Final  . Total Bilirubin 02/17/2016 0.82  0.20 - 1.20 mg/dL Final  . Alkaline Phosphatase 02/17/2016 41  40 - 150 U/L Final   . AST 02/17/2016 16  5 - 34 U/L Final  . ALT 02/17/2016 11  0 - 55 U/L Final  . Total Protein 02/17/2016 6.7  6.4 - 8.3 g/dL Final  . Albumin 02/17/2016 3.5  3.5 - 5.0 g/dL Final  . Calcium 02/17/2016 9.3  8.4 - 10.4 mg/dL Final  . Anion Gap 02/17/2016 10  3 - 11 mEq/L Final  . EGFR 02/17/2016 46* >90 ml/min/1.73 m2 Final   eGFR is calculated using the CKD-EPI Creatinine Equation (2009)    RADIOGRAPHIC STUDIES: No results found.  ASSESSMENT/PLAN:    Rectal cancer [facility]) Patient presented to the cancer Center yesterday to receive her final cycle 5 5FU pump.  She also continues to receive daily radiation treatments.  Her final radiation treatment is scheduled for 02/25/2016.  Patient reports chronic nausea, new onset diarrhea since this past weekend, and significant mucositis/thrush issues.  She feels dehydrated today as well.  Blood counts obtained yesterday revealed a mild neutropenia with ANC 1.1.  Vital signs were stable and patient was afebrile.  Reviewed all findings with Dr. Burr Medico; and she has suggested that we decrease the 5FU pump dosage somewhat to help alleviate the symptoms.  Patient is scheduled to return on Monday, 02/24/2016 for labs, visit, and her next cycle of chemotherapy.     Nausea without vomiting Patient is complaining of some chronic nausea; but no vomiting recently.  Most likely this is secondary to her chemotherapy.  She has anti--nausea medications to take at home on an as-needed basis.    Mucositis Chemotherapy-induced mucositis noted today; with thrush as well.  Patient states that her mouth.  Discomfort has caused her to have decreased oral intake.  Patient was prescribed Diflucan tablets 100 mg to take on a daily basis; as well as Magic mouthwash with lidocaine and nystatin.  Also,  will plan to decreased patient's 5 FU pump dosage somewhat to see if this helps with mucositis issues as well as diarrhea. ____________________________________  Update:   Patient states that she use the Magic mouthwash last night and also started taking the Diflucan tablets as directed.  She states that her mouth is feeling much better already.  She will continue taking the Magic mouthwash with lidocaine and nystatin as directed and the Diflucan tablets.    Hypokalemia Patient states that she is taking potassium tablets 20 mg per day while she continues with chronic diarrhea.  She understands the instructions to decrease the potassium down to 10 mEq per day.  Once the diarrhea has cleared.  Hemorrhoids Patient states that she has had chronic diarrhea since this past weekend.  She has a history of chronic hemorrhoids as well.  She states that the chronic diarrhea has irritated the hemorrhoids; and she has been noticing some bright red blood when she wipes secondary to hemorrhoids.  Patient states she's only tried over-the-counter baby cream and baby wipes to the hemorrhoids recently.  Advised patient she could try the over-the-counter Preparation H cream to the hemorrhoids as well.  Patient was advised to call/return or go directly to the emergency department if she develops any worsening rectal bleeding whatsoever.    Diarrhea Patient states that she has developed chronic diarrhea since past weekend.  She has also been nauseous and feels dehydrated as well.  Patient has only intermittently taken some Imodium.  She was encouraged to take Imodium 2 tablets every loose stool up to maximum of 8 tablets per day.  Also, advised patient we may want to try prescription for Lomotil if the Imodium does not work.      Dehydration Patient returned to the cancer Center this morning to receive IV fluid rehydration.  Patient received approximately 1 L normal saline IV fluid rehydration this morning.  She was also encouraged to push fluids is much as possible.    Patient stated understanding of all instructions; and was in agreement with this plan of care. The patient  knows to call the clinic with any problems, questions or concerns.   Total time spent with patient was 25 minutes;  with greater than 75 percent of that time spent in face to face counseling regarding patient's symptoms,  and coordination of care and follow up.  Disclaimer:This dictation was prepared with Dragon/digital dictation along with [employer]. Any transcriptional errors that result from this process are unintentional.  Drue Second, NP 02/18/2016

## 2016-02-18 NOTE — Assessment & Plan Note (Signed)
Patient states that she has had chronic diarrhea since this past weekend.  She has a history of chronic hemorrhoids as well.  She states that the chronic diarrhea has irritated the hemorrhoids; and she has been noticing some bright red blood when she wipes secondary to hemorrhoids.  Patient states she's only tried over-the-counter baby cream and baby wipes to the hemorrhoids recently.  Advised patient she could try the over-the-counter Preparation H cream to the hemorrhoids as well.  Patient was advised to call/return or go directly to the emergency department if she develops any worsening rectal bleeding whatsoever.

## 2016-02-18 NOTE — Assessment & Plan Note (Signed)
Patient returned to the [facility] this morning to receive IV fluid rehydration.  Patient received approximately 1 L normal saline IV fluid rehydration this morning.  She was also encouraged to push fluids is much as possible.

## 2016-02-18 NOTE — Progress Notes (Signed)
Oncology Nurse Navigator Documentation  Oncology Nurse Navigator Flowsheets 02/18/2016  Navigator Location [facility]Med Onc  Navigator Encounter Type Follow-up Appt  Abnormal Finding Date -  Confirmed Diagnosis Date -  Treatment Initiated Date -  Patient Visit Type MedOnc  Treatment Phase Active Tx--5FU/LV--weekly with RT (last tx 7/25)  Barriers/Navigation Needs Education  Education Preparing for [employer] Dr. Marcello Moores on 03/03/16  Interventions Education Method  Coordination of Care -  Education Method Verbal--confirmed that even though she is seen in [place], Dr. Marcello Moores will do her surgery in [place]  Acuity Level 1  Time Spent with Patient 15  Provided patient with nurse navigator contact information for any questions or needs.

## 2016-02-18 NOTE — Assessment & Plan Note (Signed)
Chemotherapy-induced mucositis noted today; with thrush as well.  Patient states that her mouth.  Discomfort has caused her to have decreased oral intake.  Patient was prescribed Diflucan tablets 100 mg to take on a daily basis; as well as Magic mouthwash with lidocaine and nystatin.  Also, will plan to decreased patient's 5 FU pump dosage somewhat to see if this helps with mucositis issues as well as diarrhea. ____________________________________  Update:  Patient states that she use the Magic mouthwash last night and also started taking the Diflucan tablets as directed.  She states that her mouth is feeling much better already.  She will continue taking the Magic mouthwash with lidocaine and nystatin as directed and the Diflucan tablets.

## 2016-02-19 ENCOUNTER — Ambulatory Visit
Admission: RE | Admit: 2016-02-19 | Discharge: 2016-02-19 | Disposition: A | Discharge status: Home / Self Care | Payer: Medicare Other | Source: Ambulatory Visit | Attending: Radiation Oncology | Admitting: Radiation Oncology

## 2016-02-19 ENCOUNTER — Inpatient Hospital Stay (HOSPITAL_COMMUNITY): Payer: Medicare Other

## 2016-02-19 ENCOUNTER — Ambulatory Visit (HOSPITAL_COMMUNITY): Payer: Medicare Other | Admitting: Hematology & Oncology

## 2016-02-20 ENCOUNTER — Telehealth: Payer: Self-pay | Admitting: *Deleted

## 2016-02-20 ENCOUNTER — Ambulatory Visit
Admission: RE | Admit: 2016-02-20 | Discharge: 2016-02-20 | Disposition: A | Discharge status: Home / Self Care | Payer: Medicare Other | Source: Ambulatory Visit | Attending: Radiation Oncology | Admitting: Radiation Oncology

## 2016-02-20 ENCOUNTER — Telehealth: Payer: Self-pay

## 2016-02-20 DIAGNOSIS — R197 Diarrhea, unspecified: Secondary | ICD-10-CM

## 2016-02-20 DIAGNOSIS — C20 Malignant neoplasm of rectum: Secondary | ICD-10-CM

## 2016-02-20 MED ORDER — DIPHENOXYLATE-ATROPINE 2.5-0.025 MG PO TABS: ORAL_TABLET | ORAL | Status: AC

## 2016-02-20 NOTE — Telephone Encounter (Signed)
Spoke with pt who states she's "still dragging low".  I asked her if her diarrhea was still present and she stated it was.  I asked if she wanted Selena Lesser to call in the lomotil that they had discussed at her previous appt and pt stated she did.  She also stated she didn't think she needed to come back in for fluids but that she would call us if she changed her mind.  Pt verbalized understanding.

## 2016-02-20 NOTE — Telephone Encounter (Signed)
Call received from [facility] with [facility] requesting Lomotil order.  Spouse waiting, per DEA, cannot transfer controlled substance between pharmacies.  Verbal order received from Selena Lesser NP and given with this call to pharmacist.

## 2016-02-21 ENCOUNTER — Ambulatory Visit
Admission: RE | Admit: 2016-02-21 | Discharge: 2016-02-21 | Disposition: A | Discharge status: Home / Self Care | Payer: Medicare Other | Source: Ambulatory Visit | Attending: Radiation Oncology | Admitting: Radiation Oncology

## 2016-02-21 ENCOUNTER — Encounter (HOSPITAL_COMMUNITY): Payer: Self-pay

## 2016-02-21 ENCOUNTER — Encounter: Payer: Self-pay | Admitting: Radiation Oncology

## 2016-02-21 ENCOUNTER — Inpatient Hospital Stay (HOSPITAL_COMMUNITY)
Admission: AD | Admit: 2016-02-21 | Discharge: 2016-03-03 | DRG: 640 | Disposition: E | Payer: Medicare Other | Source: Ambulatory Visit | Attending: Internal Medicine | Admitting: Internal Medicine

## 2016-02-21 ENCOUNTER — Ambulatory Visit: Payer: Medicare Other | Admitting: Nutrition

## 2016-02-21 VITALS — BP 141/61 | HR 91 | Temp 97.6°F | Resp 18 | Wt 127.4 lb

## 2016-02-21 DIAGNOSIS — R634 Abnormal weight loss: Secondary | ICD-10-CM

## 2016-02-21 DIAGNOSIS — N183 Chronic kidney disease, stage 3 (moderate): Secondary | ICD-10-CM | POA: Diagnosis present

## 2016-02-21 DIAGNOSIS — C20 Malignant neoplasm of rectum: Secondary | ICD-10-CM | POA: Diagnosis present

## 2016-02-21 DIAGNOSIS — E860 Dehydration: Principal | ICD-10-CM | POA: Diagnosis present

## 2016-02-21 DIAGNOSIS — D649 Anemia, unspecified: Secondary | ICD-10-CM | POA: Diagnosis present

## 2016-02-21 DIAGNOSIS — E872 Acidosis: Secondary | ICD-10-CM | POA: Diagnosis present

## 2016-02-21 DIAGNOSIS — Z806 Family history of leukemia: Secondary | ICD-10-CM

## 2016-02-21 DIAGNOSIS — E876 Hypokalemia: Secondary | ICD-10-CM | POA: Diagnosis present

## 2016-02-21 DIAGNOSIS — E119 Type 2 diabetes mellitus without complications: Secondary | ICD-10-CM

## 2016-02-21 DIAGNOSIS — J690 Pneumonitis due to inhalation of food and vomit: Secondary | ICD-10-CM | POA: Diagnosis present

## 2016-02-21 DIAGNOSIS — R197 Diarrhea, unspecified: Secondary | ICD-10-CM | POA: Diagnosis present

## 2016-02-21 DIAGNOSIS — K5660 Unspecified intestinal obstruction: Secondary | ICD-10-CM | POA: Diagnosis present

## 2016-02-21 DIAGNOSIS — K591 Functional diarrhea: Secondary | ICD-10-CM

## 2016-02-21 DIAGNOSIS — I4901 Ventricular fibrillation: Secondary | ICD-10-CM | POA: Diagnosis not present

## 2016-02-21 DIAGNOSIS — Z79899 Other long term (current) drug therapy: Secondary | ICD-10-CM

## 2016-02-21 DIAGNOSIS — Z833 Family history of diabetes mellitus: Secondary | ICD-10-CM

## 2016-02-21 DIAGNOSIS — I469 Cardiac arrest, cause unspecified: Secondary | ICD-10-CM | POA: Diagnosis not present

## 2016-02-21 DIAGNOSIS — E1122 Type 2 diabetes mellitus with diabetic chronic kidney disease: Secondary | ICD-10-CM | POA: Diagnosis present

## 2016-02-21 DIAGNOSIS — R531 Weakness: Secondary | ICD-10-CM

## 2016-02-21 DIAGNOSIS — R578 Other shock: Secondary | ICD-10-CM | POA: Diagnosis not present

## 2016-02-21 DIAGNOSIS — I959 Hypotension, unspecified: Secondary | ICD-10-CM | POA: Diagnosis not present

## 2016-02-21 DIAGNOSIS — E46 Unspecified protein-calorie malnutrition: Secondary | ICD-10-CM | POA: Diagnosis present

## 2016-02-21 DIAGNOSIS — E1165 Type 2 diabetes mellitus with hyperglycemia: Secondary | ICD-10-CM | POA: Diagnosis present

## 2016-02-21 DIAGNOSIS — J9600 Acute respiratory failure, unspecified whether with hypoxia or hypercapnia: Secondary | ICD-10-CM | POA: Diagnosis present

## 2016-02-21 DIAGNOSIS — R1110 Vomiting, unspecified: Secondary | ICD-10-CM

## 2016-02-21 DIAGNOSIS — E875 Hyperkalemia: Secondary | ICD-10-CM | POA: Diagnosis not present

## 2016-02-21 DIAGNOSIS — R001 Bradycardia, unspecified: Secondary | ICD-10-CM | POA: Diagnosis not present

## 2016-02-21 DIAGNOSIS — M7989 Other specified soft tissue disorders: Secondary | ICD-10-CM

## 2016-02-21 DIAGNOSIS — N179 Acute kidney failure, unspecified: Secondary | ICD-10-CM | POA: Diagnosis present

## 2016-02-21 DIAGNOSIS — K649 Unspecified hemorrhoids: Secondary | ICD-10-CM | POA: Diagnosis present

## 2016-02-21 DIAGNOSIS — K1231 Oral mucositis (ulcerative) due to antineoplastic therapy: Secondary | ICD-10-CM | POA: Diagnosis present

## 2016-02-21 DIAGNOSIS — I129 Hypertensive chronic kidney disease with stage 1 through stage 4 chronic kidney disease, or unspecified chronic kidney disease: Secondary | ICD-10-CM | POA: Diagnosis present

## 2016-02-21 DIAGNOSIS — D701 Agranulocytosis secondary to cancer chemotherapy: Secondary | ICD-10-CM | POA: Diagnosis present

## 2016-02-21 DIAGNOSIS — I951 Orthostatic hypotension: Secondary | ICD-10-CM | POA: Diagnosis present

## 2016-02-21 DIAGNOSIS — D702 Other drug-induced agranulocytosis: Secondary | ICD-10-CM | POA: Diagnosis not present

## 2016-02-21 DIAGNOSIS — I10 Essential (primary) hypertension: Secondary | ICD-10-CM

## 2016-02-21 DIAGNOSIS — J9601 Acute respiratory failure with hypoxia: Secondary | ICD-10-CM | POA: Diagnosis not present

## 2016-02-21 DIAGNOSIS — K1230 Oral mucositis (ulcerative), unspecified: Secondary | ICD-10-CM | POA: Diagnosis present

## 2016-02-21 DIAGNOSIS — T451X5A Adverse effect of antineoplastic and immunosuppressive drugs, initial encounter: Secondary | ICD-10-CM | POA: Diagnosis present

## 2016-02-21 DIAGNOSIS — Z91018 Allergy to other foods: Secondary | ICD-10-CM

## 2016-02-21 LAB — GLUCOSE, CAPILLARY
Glucose-Capillary: 378 mg/dL — ABNORMAL HIGH (ref 65–99)
Glucose-Capillary: 290 mg/dL — ABNORMAL HIGH (ref 65–99)
Glucose-Capillary: 223 mg/dL — ABNORMAL HIGH (ref 65–99)

## 2016-02-21 LAB — CBC
HCT: 28.9 % — ABNORMAL LOW (ref 36.0–46.0)
HEMOGLOBIN: 9.6 g/dL — AB (ref 12.0–15.0)
MCH: 27.4 pg (ref 26.0–34.0)
MCHC: 33.2 g/dL (ref 30.0–36.0)
MCV: 82.3 fL (ref 78.0–100.0)
PLATELETS: 162 10*3/uL (ref 150–400)
RBC: 3.51 MIL/uL — AB (ref 3.87–5.11)
RDW: 16.8 % — ABNORMAL HIGH (ref 11.5–15.5)
WBC: 0.2 10*3/uL — AB (ref 4.0–10.5)

## 2016-02-21 LAB — BASIC METABOLIC PANEL
ANION GAP: 7 (ref 5–15)
BUN: 46 mg/dL — ABNORMAL HIGH (ref 6–20)
CALCIUM: 8.6 mg/dL — AB (ref 8.9–10.3)
CHLORIDE: 106 mmol/L (ref 101–111)
CO2: 21 mmol/L — ABNORMAL LOW (ref 22–32)
CREATININE: 1.4 mg/dL — AB (ref 0.44–1.00)
GFR calc non Af Amer: 34 mL/min — ABNORMAL LOW (ref >60)
GFR, EST AFRICAN AMERICAN: 39 mL/min — AB (ref >60)
Glucose, Bld: 316 mg/dL — ABNORMAL HIGH (ref 65–99)
Potassium: 2.6 mmol/L — CL (ref 3.5–5.1)
SODIUM: 134 mmol/L — AB (ref 135–145)

## 2016-02-21 LAB — PROTIME-INR
INR: 1.63 — AB (ref 0.00–1.49)
PROTHROMBIN TIME: 19.4 s — AB (ref 11.6–15.2)

## 2016-02-21 LAB — URINE MICROSCOPIC-ADD ON

## 2016-02-21 LAB — DIFFERENTIAL
BASOS PCT: 0 %
Basophils Absolute: 0 10*3/uL (ref 0.0–0.1)
EOS PCT: 5 %
Eosinophils Absolute: 0 10*3/uL (ref 0.0–0.7)
LYMPHS PCT: 11 %
Lymphs Abs: 0 10*3/uL — ABNORMAL LOW (ref 0.7–4.0)
MONOS PCT: 5 %
Monocytes Absolute: 0 10*3/uL — ABNORMAL LOW (ref 0.1–1.0)
NEUTROS ABS: 0.2 10*3/uL — AB (ref 1.7–7.7)
Neutrophils Relative %: 79 %

## 2016-02-21 LAB — URINALYSIS, ROUTINE W REFLEX MICROSCOPIC
Bilirubin Urine: NEGATIVE
Glucose, UA: >1000 mg/dL — AB
Ketones, ur: NEGATIVE mg/dL
LEUKOCYTES UA: NEGATIVE
Nitrite: NEGATIVE
Protein, ur: NEGATIVE mg/dL
SPECIFIC GRAVITY, URINE: 1.021 (ref 1.005–1.030)
pH: 6 (ref 5.0–8.0)

## 2016-02-21 LAB — TSH: TSH: 0.451 u[IU]/mL (ref 0.350–4.500)

## 2016-02-21 MED ORDER — DEXTROSE-NACL 5-0.9 % IV SOLN
INTRAVENOUS | Status: AC
  Administered 2016-02-21: 10:00:00 via INTRAVENOUS
  Filled 2016-02-21: qty 1000

## 2016-02-21 MED ORDER — ONDANSETRON HCL 4 MG PO TABS: 4.0000 mg | ORAL_TABLET | Freq: Four times a day (QID) | ORAL | Status: DC | PRN

## 2016-02-21 MED ORDER — POTASSIUM CHLORIDE IN NACL 20-0.9 MEQ/L-% IV SOLN
INTRAVENOUS | Status: DC
  Administered 2016-02-21 – 2016-02-22 (×3): via INTRAVENOUS
  Filled 2016-02-21 (×5): qty 1000

## 2016-02-21 MED ORDER — ONDANSETRON HCL 4 MG/2ML IJ SOLN
4.0000 mg | Freq: Four times a day (QID) | INTRAMUSCULAR | Status: DC | PRN
  Administered 2016-02-21 – 2016-02-22 (×2): 4 mg via INTRAVENOUS
  Filled 2016-02-21 (×2): qty 2

## 2016-02-21 MED ORDER — FILGRASTIM 300 MCG/ML IJ SOLN
300.0000 ug | Freq: Once | INTRAMUSCULAR | Status: AC
  Administered 2016-02-21: 300 ug via SUBCUTANEOUS
  Filled 2016-02-21: qty 1

## 2016-02-21 MED ORDER — INSULIN ASPART 100 UNIT/ML ~~LOC~~ SOLN
0.0000 [IU] | Freq: Three times a day (TID) | SUBCUTANEOUS | Status: DC
  Administered 2016-02-21: 5 [IU] via SUBCUTANEOUS
  Administered 2016-02-22 (×2): 3 [IU] via SUBCUTANEOUS
  Administered 2016-02-22: 2 [IU] via SUBCUTANEOUS

## 2016-02-21 MED ORDER — SODIUM CHLORIDE 0.9% FLUSH
3.0000 mL | Freq: Two times a day (BID) | INTRAVENOUS | Status: DC
  Administered 2016-02-21 (×2): 3 mL via INTRAVENOUS

## 2016-02-21 MED ORDER — HYDROCODONE-ACETAMINOPHEN 5-325 MG PO TABS: 1.0000 | ORAL_TABLET | ORAL | Status: DC | PRN

## 2016-02-21 MED ORDER — MORPHINE SULFATE (PF) 2 MG/ML IV SOLN: 1.0000 mg | INTRAVENOUS | Status: DC | PRN

## 2016-02-21 MED ORDER — HEPARIN SODIUM (PORCINE) 5000 UNIT/ML IJ SOLN
5000.0000 [IU] | Freq: Three times a day (TID) | INTRAMUSCULAR | Status: DC
  Administered 2016-02-21 – 2016-02-22 (×4): 5000 [IU] via SUBCUTANEOUS
  Filled 2016-02-21 (×4): qty 1

## 2016-02-21 MED ORDER — POTASSIUM CHLORIDE CRYS ER 20 MEQ PO TBCR
40.0000 meq | EXTENDED_RELEASE_TABLET | Freq: Four times a day (QID) | ORAL | Status: AC
Start: 2016-02-21 — End: 2016-02-21
  Administered 2016-02-21 (×2): 40 meq via ORAL
  Filled 2016-02-21 (×2): qty 2

## 2016-02-21 MED ORDER — LIP MEDEX EX OINT
TOPICAL_OINTMENT | CUTANEOUS | Status: AC
  Administered 2016-02-21: 1
  Filled 2016-02-21: qty 7

## 2016-02-21 MED ORDER — ACETAMINOPHEN 650 MG RE SUPP: 650.0000 mg | Freq: Four times a day (QID) | RECTAL | Status: DC | PRN

## 2016-02-21 MED ORDER — ACETAMINOPHEN 325 MG PO TABS: 650.0000 mg | ORAL_TABLET | Freq: Four times a day (QID) | ORAL | Status: DC | PRN

## 2016-02-21 NOTE — Progress Notes (Signed)
1115. Patient experiencing second episode of diarrhea. Patient incontinent of bowels. Assisted patient with cleaning herself up and provided her with a pair of clean pants. Assisted patient back to geri chair. Will continue to monitor patient.

## 2016-02-21 NOTE — Addendum Note (Signed)
Encounter addended by: Heywood Footman, RN on: 02/10/2016 12:50 PM<BR>     Documentation filed: Notes Section, [place] Section, Inpatient MAR

## 2016-02-21 NOTE — Progress Notes (Signed)
Phoned Thu, RN for Dr. Burr Medico informing her of patient's direct admission.

## 2016-02-21 NOTE — Progress Notes (Signed)
Blood sugar 399. Informed Dr. Tammi Klippel of these findings.

## 2016-02-21 NOTE — Progress Notes (Signed)
CRITICAL VALUE ALERT  Critical value received:  K+ 2.6, WBC 0.2  Date of notification:  03/02/2016  Time of notification:  [id number]  Critical value read back:Yes.    Nurse who received alert:  Laural Benes, RN  MD notified (1st page):  Dr. Hartford Poli  Time of first page:  1600  MD notified (2nd page):  Time of second page:  Responding MD:  Dr. Hartford Poli  Time MD responded:  [phone]

## 2016-02-21 NOTE — Progress Notes (Signed)
Hydration complete. Patient reports she only feels slightly better. Vitals have improved. Suspect continued slow hydration is needed plus, diarrhea and blood sugar management. Per Dr. Johny Shears order contact hospitalist for admission. Patient agrees to plan.

## 2016-02-21 NOTE — Progress Notes (Signed)
Cheyenne Morris   DOB:07/21/1931   [id number]   [id number]  Subjective: Pt is known to me, currently my care for chemotherapy with concurrent radiation for rectal cancer. This is a her last week of treatment. She was seen by our nurse practitioner 2 days ago for mucositis and dehydration. She was admitted to [facility] today for hypotension. Per her husband, she has had significant mouth sore, taking Magic mouthwash, but has been eating less, also complains of rectal discomfort from radiation,  no fever or chills, she has lost about 10lbs in the past one month. Patient was quite sleepy when he saw her, but easily arousable.    Objective:  Filed Vitals:   02/20/2016 1332  BP: 139/78  Pulse: 101  Temp: 97.8 F (36.6 C)  Resp: 16    Body mass index is 23.22 kg/(m^2).  Intake/Output Summary (Last 24 hours) at 02/16/2016 2007 Last data filed at 02/17/2016 1825  Gross per 24 hour  Intake    400 ml  Output      0 ml  Net    400 ml     Sclerae unicteric  Oropharynx (+) mucosa erythema, and oral thrush  No peripheral adenopathy  Lungs clear -- no rales or rhonchi  Heart regular rate and rhythm  Abdomen benign  MSK no focal spinal tenderness, no peripheral edema  Neuro nonfocal   CBG (last 3)   Recent Labs  02/04/2016 1337 02/06/2016 1551  GLUCAP 378* 290*     Labs:  CBC Latest Ref Rng 02/08/2016 02/17/2016 02/10/2016  WBC 4.0 - 10.5 K/uL 0.2(LL) 1.2(L) 2.7(L)  Hemoglobin 12.0 - 15.0 g/dL 9.6(L) 9.8(L) 9.3(L)  Hematocrit 36.0 - 46.0 % 28.9(L) 29.5(L) 28.8(L)  Platelets 150 - 400 K/uL 162 184 164     CMP Latest Ref Rng 02/03/2016 02/17/2016 02/10/2016  Glucose 65 - 99 mg/dL 316(H) 137 100  BUN 6 - 20 mg/dL 46(H) 32.4(H) 20.6  Creatinine 0.44 - 1.00 mg/dL 1.40(H) 1.3(H) 1.2(H)  Sodium 135 - 145 mmol/L 134(L) 136 135(L)  Potassium 3.5 - 5.1 mmol/L 2.6(LL) 3.2(L) 3.4(L)  Chloride 101 - 111 mmol/L 106 - -  CO2 22 - 32 mmol/L 21(L) 24 23  Calcium 8.9 - 10.3 mg/dL 8.6(L)  9.3 9.4  Total Protein 6.4 - 8.3 g/dL - 6.7 7.1  Total Bilirubin 0.20 - 1.20 mg/dL - 0.82 0.84  Alkaline Phos 40 - 150 U/L - 41 50  AST 5 - 34 U/L - 16 18  ALT 0 - 55 U/L - 11 11      Urine Studies No results for input(s): UHGB, CRYS in the last 72 hours.  Invalid input(s): UACOL, UAPR, USPG, UPH, UTP, UGL, UKET, UBIL, UNIT, UROB, ULEU, UEPI, UWBC, URBC, UBAC, CAST, UCOM, BILUA  Basic Metabolic Panel:  Recent Labs Lab 02/17/16 1333 02/12/2016 1450  NA 136 134*  K 3.2* 2.6*  CL  --  106  CO2 24 21*  GLUCOSE 137 316*  BUN 32.4* 46*  CREATININE 1.3* 1.40*  CALCIUM 9.3 8.6*   GFR Estimated Creatinine Clearance: 24.5 mL/min (by C-G formula based on Cr of 1.4). Liver Function Tests:  Recent Labs Lab 02/17/16 1333  AST 16  ALT 11  ALKPHOS 41  BILITOT 0.82  PROT 6.7  ALBUMIN 3.5   No results for input(s): LIPASE, AMYLASE in the last 168 hours. No results for input(s): AMMONIA in the last 168 hours. Coagulation profile  Recent Labs Lab 02/21/16 1450  INR 1.63*  CBC:  Recent Labs Lab 02/17/16 1333 02/27/2016 1450  WBC 1.2* 0.2*  NEUTROABS 1.1* 0.2*  HGB 9.8* 9.6*  HCT 29.5* 28.9*  MCV 83.9 82.3  PLT 184 162   Cardiac Enzymes: No results for input(s): CKTOTAL, CKMB, CKMBINDEX, TROPONINI in the last 168 hours. BNP: Invalid input(s): POCBNP CBG:  Recent Labs Lab 02/13/2016 1337 02/06/2016 1551  GLUCAP 378* 290*   D-Dimer No results for input(s): DDIMER in the last 72 hours. Hgb A1c No results for input(s): HGBA1C in the last 72 hours. Lipid Profile No results for input(s): CHOL, HDL, LDLCALC, TRIG, CHOLHDL, LDLDIRECT in the last 72 hours. Thyroid function studies  Recent Labs  02/02/2016 1450  TSH 0.451   Anemia work up No results for input(s): VITAMINB12, FOLATE, FERRITIN, TIBC, IRON, RETICCTPCT in the last 72 hours. Microbiology No results found for this or any previous visit (from the past 240 hour(s)).    Studies:  No results  found.  Assessment: 82 y.o. female with stage IIA rectal cancer, undergoing concurrent neoadjuvant chemotherapy and radiation.  1. Hypotension from poor oral intake, diarrhea and dehydration  2. Severe neutropenia from chemo 3. Hypokalemia 4. Mucositis from chemotherapy 5. Anemia 6. HTN  7. Type 2 DM  8. Malnutrition and weight loss     Plan:  -stop 5-fu chemo infusion, I have discussed with inpatient pharmacist this morning. I do not plan to restart chemo on Monday, she only has 2 more days of radiation left. -continue IV fluids hydration, potassium replacement -will give neupogen 375mcg tonight (I'll take the liberty to order), and please order one more dose tomorrow night if ANC<0.5 tomorrow, will hold on Sunday because she may have radiation on Monday  -if she spikes fever, please treat neutropenic fever with broad antibiotics  -continue Magic mouthwash, pain medication, and other supportive care. -Greatly appreciate the excellent care from our hospitalist team  -I'll follow-up on Monday. Please call us if you have questions  Truitt Merle, MD 02/23/2016  8:07 PM

## 2016-02-21 NOTE — Progress Notes (Signed)
82 year old female diagnosed with rectal cancer.  She is a patient of Dr. Tammi Klippel.  Past medical history includes diabetes and hypertension.  Medications includeTums, calcium with vitamin D, Lomotil, Amaryl, Prilosec, Zofran, MiraLAX, Compazine, and Zocor.  Labs include potassium 3.2, albumin 3.5, creatinine 1.3 and glucose 137.  Height: 62 inches. Weight: 127.4 pounds. Usual body weight: 140 pounds May 2017. BMI: 23.3.  Patient reports poor appetite and confirms 13 pound weight loss. She complains of fatigue. Reports she is having 3-4 loose stools a day. Has tried oral nutrition supplements and states she tolerates well. Patient reports one episode of vomiting this morning.  Nutrition diagnosis: Inadequate oral intake related to poor appetite as evidenced by 9% weight loss over 2 months.  Intervention: Educated patient to consume small frequent meals and snacks consisting of high protein high calorie foods every 2-3 hours. Encouraged patient to set timer to remind her to eat. Encouraged patient to try oral nutrition supplements 2-3 times daily. Reviewed high-calorie high-protein foods and provided fact sheet. Educated patient on low fiber diet to improve diarrhea and provided fact sheet. Also provided fact sheet on nausea and vomiting in case these episodes worsen. Questions were answered.  Teach back method was used.  Contact information was given. Oral nutrition supplements provided.  Monitoring, evaluation, goals: Patient will tolerate increased calories and protein to minimize further weight loss for improved quality-of-life.  Next visit: Patient contact me for questions or concerns.  **Disclaimer: This note was dictated with voice recognition software. Similar sounding words can inadvertently be transcribed and this note may contain transcription errors which may not have been corrected upon publication of note.**

## 2016-02-21 NOTE — Progress Notes (Signed)
  Radiation Oncology         [phone]   Name: Cheyenne Morris MRN: [id number]   Date: 02/17/2016  DOB: Jan 14, 1931   Weekly Radiation Therapy Management    Current Dose: 41.4 Gy  Planned Dose:  45 Gy  Narrative The patient presents for routine under treatment assessment.  Orthostatic vitals. Weight loss noted. Denies pain. Reports diarrhea. Reports she feel very weak and fatigued today. Reports rectal irritation despite Tucks. Reports her hemorrhoids are bothering her. Reports she isn't eating or drinking well. Denies having an appetite. Denies nausea or vomiting.  The patient's daughter reports that she has not been eating.  The patient reports that she does not want to eat food.    Set-up films were reviewed. The chart was checked.  Physical Findings  weight is 127 lb 6.4 oz (57.788 kg). Her oral temperature is 98 F (36.7 C). Her blood pressure is 79/48 and her pulse is 116. Her respiration is 16 and oxygen saturation is 99%. .  The patient has poor skin turgor.   Impression The patient is tolerating radiation.  Plan Continue treatment as planned. The patient will be receiving fluids through IV today as well to treat her dehydration.  Dr. Tammi Klippel will request that a dietician to come and see the patient as well same day.   Consider holding lisinpril and maxzide if not already     Rodman Key A. Tammi Klippel, M.D.     This document serves as a record of services personally performed by Tyler Pita, MD. It was created on his behalf by Truddie Hidden, a trained medical scribe. The creation of this record is based on the scribe's personal observations and the provider's statements to them. This document has been checked and approved by the attending provider.

## 2016-02-21 NOTE — Progress Notes (Signed)
Delivered patient to [place], room 1345. Provided Cheyenne Alcon, RN with report. Patient alert and oriented x 3. No distress noted. Sitting on edge of bed changing into gown with NA assistance surrounded by her family when I exited.

## 2016-02-21 NOTE — Progress Notes (Signed)
Orthostatic vitals. Weight loss noted. Denies pain. Reports diarrhea. Reports she feel very weak and fatigued today. Reports rectal irritation despite Tucks. Reports her hemorrhoids are bothering her. Reports she isn't eating or drinking well. Denies having an appetite. Denies nausea or vomiting.   BP 79/48 mmHg  Pulse 116  Temp(Src) 98 F (36.7 C) (Oral)  Resp 16  Wt 127 lb 6.4 oz (57.788 kg)  SpO2 99% Wt Readings from Last 3 Encounters:  02/18/2016 127 lb 6.4 oz (57.788 kg)  02/18/16 128 lb 8 oz (58.287 kg)  02/17/16 126 lb 12.8 oz (57.516 kg)     Sitting 108, 88/54 Standing 116, 79/48

## 2016-02-21 NOTE — H&P (Signed)
History and Physical    Cheyenne Morris [id number] DOB: 12/19/1930 DOA: 02/23/2016  PCP: Robert Bellow, MD  Patient coming from: Home, sent by Dr. Tammi Klippel  Chief Complaint: Generalized weakness and dizziness  HPI: Cheyenne Morris is a 82 y.o. female with medical history significant of stage IIA rectal cancer, she is undergoing continuous 5-FU infusion along with daily radiation. She was presented initially to Dr. Johny Shears office for her routine radiation, but reported that she is been so weak, dizzy upon standing. She also reported diarrhea for several days. Initial blood pressure was 88/54 which dropped down to 79/48 upon standing. Patient was given IV fluids, was found to have blood sugar of 399 so direct admit was requested.  Review of Systems: As per HPI otherwise 10 point review of systems negative.    Past Medical History  Diagnosis Date  . Diabetes mellitus   . Hypertension   . Rectal cancer [facility])     Past Surgical History  Procedure Laterality Date  . Colonoscopy    . Cataract extraction, bilateral    . Colonoscopy  01/05/2012    Dr Jenkins:Sessile polyp in the ascending colon/Normal colonoscopy otherwise, path tubular adenoma  . Colonoscopy N/A 12/20/2015    Procedure: COLONOSCOPY;  Surgeon: Danie Binder, MD;  [facility];  Service: Endoscopy;  Laterality: N/A;  0930  . Esophagogastroduodenoscopy N/A 12/20/2015    Procedure: ESOPHAGOGASTRODUODENOSCOPY (EGD);  Surgeon: Danie Binder, MD;  [facility];  Service: Endoscopy;  Laterality: N/A;  . Eus N/A 01/02/2016    Procedure: LOWER ENDOSCOPIC ULTRASOUND (EUS);  Surgeon: [name], MD;  Location: [facility];  Service: Endoscopy;  Laterality: N/A;     reports that she has never smoked. She has never used smokeless tobacco. She reports that she does not drink alcohol or use illicit drugs.  Allergies  Allergen Reactions  . Lime [Lime, Sulfurated] Swelling    Lips swell    Family  History  Problem Relation Age of Onset  . Diabetes Son   . Colon cancer Neg Hx   . Cancer Mother     leukemia    Prior to Admission medications   Medication Sig Start Date End Date Taking? Authorizing Provider  Calcium Carbonate-Vitamin D (CALCIUM-VITAMIN D) 600-200 MG-UNIT CAPS Take 1 tablet by mouth every morning.     Historical Provider, MD  diphenoxylate-atropine (LOMOTIL) 2.5-0.025 MG tablet Take two (2) caps 4 to 6 times daily, alternate with Imodium prn diarrhea 02/20/16   Susanne Borders, NP  fluconazole (DIFLUCAN) 100 MG tablet Take 1 tablet (100 mg total) by mouth daily. 02/17/16   Susanne Borders, NP  fluorouracil CALGB 10932 in sodium chloride 0.9 % 150 mL Inject into the vein. NOTE: this medication hasn't started yet    Historical Provider, MD  glimepiride (AMARYL) 2 MG tablet Take 2 mg by mouth daily before breakfast.      Historical Provider, MD  iron polysaccharides (NIFEREX) 150 MG capsule Take 1 capsule (150 mg total) by mouth daily. 01/13/16   Patrici Ranks, MD  latanoprost (XALATAN) 0.005 % ophthalmic solution Place 1 drop into both eyes at bedtime.     Historical Provider, MD  lidocaine-prilocaine (EMLA) cream Apply a quarter size amount to port site 1 hour prior to chemo. Do not rub in. Cover with plastic wrap. 01/14/16   Patrici Ranks, MD  lisinopril (PRINIVIL,ZESTRIL) 20 MG tablet Take 20 mg by mouth daily.  Historical Provider, MD  magic mouthwash w/lidocaine SOLN Take 5 mLs by mouth 4 (four) times daily as needed for mouth pain ([facility] formula w/ nystatin and lidocaine 1:1:1.). 02/17/16   Susanne Borders, NP  omeprazole (PRILOSEC) 10 MG capsule Take 10 mg by mouth daily.    Historical Provider, MD  ondansetron (ZOFRAN) 8 MG tablet Take 1 tablet (8 mg total) by mouth every 8 (eight) hours as needed for nausea or vomiting. 01/11/16   Patrici Ranks, MD  polyethylene glycol (MIRALAX / GLYCOLAX) packet Take 17 g by mouth daily. Reported on 01/13/2016    Historical  Provider, MD  potassium chloride SA (K-DUR,KLOR-CON) 20 MEQ tablet Take 20 mEq by mouth daily. Takes 1/2 tablet daily    Historical Provider, MD  prochlorperazine (COMPAZINE) 10 MG tablet Take 1 tablet (10 mg total) by mouth every 6 (six) hours as needed for nausea or vomiting. 01/11/16   Patrici Ranks, MD  simvastatin (ZOCOR) 40 MG tablet  01/02/16   Historical Provider, MD  triamterene-hydrochlorothiazide (MAXZIDE) 75-50 MG tablet  12/10/15   Historical Provider, MD    Physical Exam: Filed Vitals:   02/26/2016 1332  BP: 139/78  Pulse: 101  Temp: 97.8 F (36.6 C)  TempSrc: Oral  Resp: 16  Height: 5\' 2"  (1.575 m)  Weight: 57.607 kg (127 lb)  SpO2: 100%      Constitutional: NAD, calm, comfortable Filed Vitals:   02/20/2016 1332  BP: 139/78  Pulse: 101  Temp: 97.8 F (36.6 C)  TempSrc: Oral  Resp: 16  Height: 5\' 2"  (1.575 m)  Weight: 57.607 kg (127 lb)  SpO2: 100%   Eyes: PERRL, lids and conjunctivae normal ENMT: Mucous membranes are moist. Posterior pharynx clear of any exudate or lesions.Normal dentition.  Neck: normal, supple, no masses, no thyromegaly Respiratory: clear to auscultation bilaterally, no wheezing, no crackles. Normal respiratory effort. No accessory muscle use.  Cardiovascular: Regular rate and rhythm, no murmurs / rubs / gallops. No extremity edema. 2+ pedal pulses. No carotid bruits.  Abdomen: no tenderness, no masses palpated. No hepatosplenomegaly. Bowel sounds positive.  Musculoskeletal: no clubbing / cyanosis. No joint deformity upper and lower extremities. Good ROM, no contractures. Normal muscle tone.  Skin: no rashes, lesions, ulcers. No induration Neurologic: CN 2-12 grossly intact. Sensation intact, DTR normal. Strength 5/5 in all 4.  Psychiatric: Normal judgment and insight. Alert and oriented x 3. Normal mood.   Labs on Admission: I have personally reviewed following labs and imaging studies  CBC:  Recent Labs Lab 02/17/16 1333  WBC 1.2*    NEUTROABS 1.1*  HGB 9.8*  HCT 29.5*  MCV 83.9  PLT [id number]   Basic Metabolic Panel:  Recent Labs Lab 02/17/16 1333  NA 136  K 3.2*  CO2 24  GLUCOSE 137  BUN 32.4*  CREATININE 1.3*  CALCIUM 9.3   GFR: Estimated Creatinine Clearance: 26.4 mL/min (by C-G formula based on Cr of 1.3). Liver Function Tests:  Recent Labs Lab 02/17/16 1333  AST 16  ALT 11  ALKPHOS 41  BILITOT 0.82  PROT 6.7  ALBUMIN 3.5   No results for input(s): LIPASE, AMYLASE in the last 168 hours. No results for input(s): AMMONIA in the last 168 hours. Coagulation Profile: No results for input(s): INR, PROTIME in the last 168 hours. Cardiac Enzymes: No results for input(s): CKTOTAL, CKMB, CKMBINDEX, TROPONINI in the last 168 hours. BNP (last 3 results) No results for input(s): PROBNP in the last 8760 hours. HbA1C: No results  for input(s): HGBA1C in the last 72 hours. CBG:  Recent Labs Lab 02/10/2016 1337  GLUCAP 378*   Lipid Profile: No results for input(s): CHOL, HDL, LDLCALC, TRIG, CHOLHDL, LDLDIRECT in the last 72 hours. Thyroid Function Tests: No results for input(s): TSH, T4TOTAL, FREET4, T3FREE, THYROIDAB in the last 72 hours. Anemia Panel: No results for input(s): VITAMINB12, FOLATE, FERRITIN, TIBC, IRON, RETICCTPCT in the last 72 hours. Urine analysis:    Component Value Date/Time   COLORURINE YELLOW 08/01/2011 1802   APPEARANCEUR CLEAR 08/01/2011 1802   LABSPEC 1.015 08/01/2011 1802   PHURINE 7.0 08/01/2011 1802   GLUCOSEU NEGATIVE 08/01/2011 1802   HGBUR TRACE* 08/01/2011 1802   BILIRUBINUR NEGATIVE 08/01/2011 1802   KETONESUR TRACE* 08/01/2011 1802   PROTEINUR NEGATIVE 08/01/2011 1802   UROBILINOGEN 0.2 08/01/2011 1802   NITRITE NEGATIVE 08/01/2011 1802   LEUKOCYTESUR NEGATIVE 08/01/2011 1802   )No results found for this or any previous visit (from the past 240 hour(s)).   Radiological Exams on Admission: No results found.  EKG: Not done  Assessment/Plan Principal  Problem:   Dehydration Active Problems:   Rectal cancer (HCC)   Diarrhea   Mucositis   Hypokalemia   Hemorrhoids   Chemotherapy induced neutropenia (HCC)   Dehydration -Directly admitted from Dr. Johny Shears office because of dehydration, diarrhea and orthostatic hypotension. -Given 1 L of fluids, I'll continue IV fluid and 100 mL/hour. -Patient was orthostatic on admission, no BMP to know if she has acute renal failure. -Check BMP now and daily.  Diarrhea and probable mucositis -Patient is on continuous 5-FU drip, now has daily diarrhea which probably what caused the dehydration. -This is likely secondary to mucositis from 5-FU, started on Lomotil.  Hypokalemia -Check BMP, potassium of 3.2 in the office started on IV fluids with potassium.  Hemorrhoids -Exacerbated by the recent diarrhea, hydrocortisone.  Diabetes mellitus type 2 -With elevated blood sugar this morning of 399 in the office, patient received IV D5W. -Will control with SSI,, graded modified diet, check A1c.  Neutrophilia secondary to chemotherapy -Last WBC was 1.2 on 7/17, check CBC with differential  Stage IIA rectal cancer -Continue 5-FU and daily radiation.  DVT prophylaxis: Subcutaneous heparin Code Status: Full code Family Communication: Plan discussed with the patient in the presence of her husband and 2 daughters at bedside. Disposition Plan: Home when she is appropriate for discharge Consults called: None Admission status: MedSurg/inpatient.   [facility] A MD [facility] Pager [phone]  If 7PM-7AM, please contact night-coverage [email or web address] Password [email or web address]  02/09/2016, 1:57 PM

## 2016-02-22 ENCOUNTER — Encounter: Payer: Self-pay | Admitting: Anesthesiology

## 2016-02-22 ENCOUNTER — Inpatient Hospital Stay (HOSPITAL_COMMUNITY): Payer: Medicare Other

## 2016-02-22 DIAGNOSIS — D702 Other drug-induced agranulocytosis: Secondary | ICD-10-CM

## 2016-02-22 DIAGNOSIS — I469 Cardiac arrest, cause unspecified: Secondary | ICD-10-CM

## 2016-02-22 DIAGNOSIS — C20 Malignant neoplasm of rectum: Secondary | ICD-10-CM

## 2016-02-22 DIAGNOSIS — R531 Weakness: Secondary | ICD-10-CM

## 2016-02-22 DIAGNOSIS — J9601 Acute respiratory failure with hypoxia: Secondary | ICD-10-CM

## 2016-02-22 LAB — BASIC METABOLIC PANEL
ANION GAP: 8 (ref 5–15)
ANION GAP: 19 — AB (ref 5–15)
BUN: 44 mg/dL — AB (ref 6–20)
BUN: 48 mg/dL — ABNORMAL HIGH (ref 6–20)
CHLORIDE: 109 mmol/L (ref 101–111)
CHLORIDE: 116 mmol/L — AB (ref 101–111)
CO2: 18 mmol/L — ABNORMAL LOW (ref 22–32)
CO2: 13 mmol/L — ABNORMAL LOW (ref 22–32)
CREATININE: 2.35 mg/dL — AB (ref 0.44–1.00)
Calcium: 8.2 mg/dL — ABNORMAL LOW (ref 8.9–10.3)
Calcium: 13.4 mg/dL (ref 8.9–10.3)
Creatinine, Ser: 1.5 mg/dL — ABNORMAL HIGH (ref 0.44–1.00)
GFR calc non Af Amer: 31 mL/min — ABNORMAL LOW (ref >60)
GFR calc non Af Amer: 18 mL/min — ABNORMAL LOW (ref >60)
GFR, EST AFRICAN AMERICAN: 36 mL/min — AB (ref >60)
GFR, EST AFRICAN AMERICAN: 21 mL/min — AB (ref >60)
Glucose, Bld: 222 mg/dL — ABNORMAL HIGH (ref 65–99)
Glucose, Bld: 138 mg/dL — ABNORMAL HIGH (ref 65–99)
POTASSIUM: 3.3 mmol/L — AB (ref 3.5–5.1)
POTASSIUM: 6.9 mmol/L — AB (ref 3.5–5.1)
SODIUM: 135 mmol/L (ref 135–145)
SODIUM: 148 mmol/L — AB (ref 135–145)

## 2016-02-22 LAB — CBC WITH DIFFERENTIAL/PLATELET
BASOS ABS: 0 10*3/uL (ref 0.0–0.1)
Basophils Relative: 0 %
EOS PCT: 6 %
Eosinophils Absolute: 0 10*3/uL (ref 0.0–0.7)
HEMATOCRIT: 30.1 % — AB (ref 36.0–46.0)
Hemoglobin: 10.4 g/dL — ABNORMAL LOW (ref 12.0–15.0)
LYMPHS PCT: 13 %
Lymphs Abs: 0 10*3/uL — ABNORMAL LOW (ref 0.7–4.0)
MCH: 27.8 pg (ref 26.0–34.0)
MCHC: 34.6 g/dL (ref 30.0–36.0)
MCV: 80.5 fL (ref 78.0–100.0)
MONOS PCT: 13 %
Monocytes Absolute: 0 10*3/uL — ABNORMAL LOW (ref 0.1–1.0)
NEUTROS PCT: 68 %
Neutro Abs: 0.2 10*3/uL — ABNORMAL LOW (ref 1.7–7.7)
Platelets: 158 10*3/uL (ref 150–400)
RBC: 3.74 MIL/uL — AB (ref 3.87–5.11)
RDW: 17.1 % — ABNORMAL HIGH (ref 11.5–15.5)
WBC: 0.2 10*3/uL — AB (ref 4.0–10.5)

## 2016-02-22 LAB — BLOOD GAS, ARTERIAL
Acid-base deficit: 25.1 mmol/L — ABNORMAL HIGH (ref 0.0–2.0)
BICARBONATE: 6.3 meq/L — AB (ref 20.0–24.0)
FIO2: 1
O2 Saturation: 97.6 %
PATIENT TEMPERATURE: 37
PCO2 ART: 37.8 mmHg (ref 35.0–45.0)
PEEP/CPAP: 5 cmH2O
PH ART: 6.853 — AB (ref 7.350–7.450)
RATE: 22 resp/min
TCO2: 7 mmol/L (ref 0–100)
VT: 400 mL
pO2, Arterial: 273 mmHg — ABNORMAL HIGH (ref 80.0–100.0)

## 2016-02-22 LAB — TROPONIN I: Troponin I: 0.06 ng/mL (ref <0.03)

## 2016-02-22 LAB — MRSA PCR SCREENING: MRSA by PCR: NEGATIVE

## 2016-02-22 LAB — CBC
HEMATOCRIT: 23.5 % — AB (ref 36.0–46.0)
HEMOGLOBIN: 7.7 g/dL — AB (ref 12.0–15.0)
MCH: 27.9 pg (ref 26.0–34.0)
MCHC: 32.8 g/dL (ref 30.0–36.0)
MCV: 85.1 fL (ref 78.0–100.0)
PLATELETS: 115 10*3/uL — AB (ref 150–400)
RBC: 2.76 MIL/uL — AB (ref 3.87–5.11)
RDW: 17.8 % — ABNORMAL HIGH (ref 11.5–15.5)
WBC: 0.1 10*3/uL — AB (ref 4.0–10.5)

## 2016-02-22 LAB — MAGNESIUM: Magnesium: 2.1 mg/dL (ref 1.7–2.4)

## 2016-02-22 LAB — HEMOGLOBIN A1C
Hgb A1c MFr Bld: 6.9 % — ABNORMAL HIGH (ref 4.8–5.6)
Mean Plasma Glucose: 151 mg/dL

## 2016-02-22 LAB — LACTIC ACID, PLASMA: Lactic Acid, Venous: 15 mmol/L (ref 0.5–1.9)

## 2016-02-22 MED ORDER — SODIUM BICARBONATE 8.4 % IV SOLN
INTRAVENOUS | Status: DC
  Administered 2016-02-22: 15:00:00 via INTRAVENOUS
  Filled 2016-02-22: qty 150

## 2016-02-22 MED ORDER — SODIUM POLYSTYRENE SULFONATE 15 GM/60ML PO SUSP
30.0000 g | Freq: Once | ORAL | Status: AC
  Administered 2016-02-22: 30 g
  Filled 2016-02-22: qty 120

## 2016-02-22 MED ORDER — VASOPRESSIN 20 UNIT/ML IV SOLN
0.0300 [IU]/min | INTRAVENOUS | Status: DC
  Administered 2016-02-22: 0.03 [IU]/min via INTRAVENOUS
  Filled 2016-02-22: qty 2

## 2016-02-22 MED ORDER — NOREPINEPHRINE BITARTRATE 1 MG/ML IV SOLN
0.0000 ug/min | INTRAVENOUS | Status: AC
  Administered 2016-02-22: 35 ug/min via INTRAVENOUS
  Administered 2016-02-22: 22 ug/min via INTRAVENOUS
  Filled 2016-02-22: qty 4

## 2016-02-22 MED ORDER — SODIUM CHLORIDE 0.9% FLUSH: 10.0000 mL | INTRAVENOUS | Status: DC | PRN

## 2016-02-22 MED ORDER — CHLORHEXIDINE GLUCONATE 0.12% ORAL RINSE (MEDLINE KIT): 15.0000 mL | Freq: Two times a day (BID) | OROMUCOSAL | Status: DC

## 2016-02-22 MED ORDER — MAGIC MOUTHWASH W/LIDOCAINE
5.0000 mL | Freq: Four times a day (QID) | ORAL | Status: DC | PRN
  Filled 2016-02-22: qty 5

## 2016-02-22 MED ORDER — HYDROCORTISONE 2.5 % RE CREA
TOPICAL_CREAM | Freq: Three times a day (TID) | RECTAL | Status: DC
  Administered 2016-02-22: 11:00:00 via RECTAL
  Filled 2016-02-22: qty 28.35

## 2016-02-22 MED ORDER — ANTISEPTIC ORAL RINSE SOLUTION (CORINZ): 7.0000 mL | Freq: Four times a day (QID) | OROMUCOSAL | Status: DC

## 2016-02-22 MED ORDER — VASOPRESSIN 20 UNIT/ML IV SOLN: 0.0300 [IU]/min | INTRAVENOUS | Status: DC

## 2016-02-22 MED ORDER — SODIUM BICARBONATE 8.4 % IV SOLN
INTRAVENOUS | Status: AC
  Filled 2016-02-22: qty 100

## 2016-02-22 MED ORDER — EPINEPHRINE HCL 1 MG/ML IJ SOLN
0.5000 ug/min | INTRAVENOUS | Status: DC
  Administered 2016-02-22: 0.5 ug/min via INTRAVENOUS
  Filled 2016-02-22: qty 4

## 2016-02-22 MED ORDER — POTASSIUM CHLORIDE CRYS ER 20 MEQ PO TBCR
60.0000 meq | EXTENDED_RELEASE_TABLET | Freq: Once | ORAL | Status: AC
  Administered 2016-02-22: 60 meq via ORAL
  Filled 2016-02-22: qty 3

## 2016-02-22 MED ORDER — DEXTROSE 5 % IV SOLN
2.0000 g | INTRAVENOUS | Status: DC
  Administered 2016-02-22: 2 g via INTRAVENOUS
  Filled 2016-02-22: qty 2

## 2016-02-22 MED ORDER — FILGRASTIM 300 MCG/ML IJ SOLN
300.0000 ug | Freq: Once | INTRAMUSCULAR | Status: AC
Start: 2016-02-22 — End: 2016-02-22
  Administered 2016-02-22: 300 ug via SUBCUTANEOUS
  Filled 2016-02-22: qty 1

## 2016-02-22 MED ORDER — SODIUM BICARBONATE 8.4 % IV SOLN
100.0000 meq | Freq: Once | INTRAVENOUS | Status: AC
  Administered 2016-02-22: 100 meq via INTRAVENOUS

## 2016-02-22 MED ORDER — CALCITONIN (SALMON) 200 UNIT/ML IJ SOLN
50.0000 [IU] | Freq: Every day | INTRAMUSCULAR | Status: DC
  Administered 2016-02-22: 50 [IU] via SUBCUTANEOUS
  Filled 2016-02-22: qty 0.25

## 2016-02-22 MED ORDER — WITCH HAZEL-GLYCERIN EX PADS: 1.0000 "application " | MEDICATED_PAD | CUTANEOUS | Status: DC | PRN

## 2016-02-22 MED ORDER — NOREPINEPHRINE BITARTRATE 1 MG/ML IV SOLN
0.0000 ug/min | INTRAVENOUS | Status: DC
  Administered 2016-02-22: 40 ug/min via INTRAVENOUS
  Filled 2016-02-22: qty 16

## 2016-02-22 MED ORDER — PIPERACILLIN-TAZOBACTAM 3.375 G IVPB 30 MIN
3.3750 g | Freq: Once | INTRAVENOUS | Status: AC
  Administered 2016-02-22: 3.375 g via INTRAVENOUS
  Filled 2016-02-22: qty 50

## 2016-02-22 MED ORDER — VANCOMYCIN HCL IN DEXTROSE 1-5 GM/200ML-% IV SOLN
1000.0000 mg | Freq: Once | INTRAVENOUS | Status: AC
Start: 2016-02-22 — End: 2016-02-22
  Administered 2016-02-22: 1000 mg via INTRAVENOUS
  Filled 2016-02-22: qty 200

## 2016-02-22 MED ORDER — PIPERACILLIN-TAZOBACTAM 3.375 G IVPB: 3.3750 g | Freq: Three times a day (TID) | INTRAVENOUS | Status: DC

## 2016-02-22 MED ORDER — VANCOMYCIN HCL 500 MG IV SOLR: 500.0000 mg | INTRAVENOUS | Status: DC

## 2016-02-22 MED FILL — Medication: Qty: 1 | Status: AC

## 2016-02-23 LAB — GLUCOSE, CAPILLARY: Glucose-Capillary: 168 mg/dL — ABNORMAL HIGH (ref 65–99)

## 2016-02-24 ENCOUNTER — Ambulatory Visit: Payer: Medicare Other

## 2016-02-24 ENCOUNTER — Ambulatory Visit: Payer: Medicare Other | Admitting: Hematology

## 2016-02-25 ENCOUNTER — Other Ambulatory Visit: Payer: Medicare Other

## 2016-02-25 ENCOUNTER — Ambulatory Visit: Payer: Medicare Other

## 2016-02-25 ENCOUNTER — Other Ambulatory Visit: Payer: Self-pay | Admitting: Radiation Oncology

## 2016-02-25 ENCOUNTER — Ambulatory Visit: Payer: Medicare Other | Admitting: Hematology

## 2016-02-25 LAB — GLUCOSE, CAPILLARY
GLUCOSE-CAPILLARY: 217 mg/dL — AB (ref 65–99)
Glucose-Capillary: 224 mg/dL — ABNORMAL HIGH (ref 65–99)

## 2016-02-26 ENCOUNTER — Other Ambulatory Visit (HOSPITAL_COMMUNITY): Payer: Medicare Other

## 2016-03-02 ENCOUNTER — Other Ambulatory Visit: Payer: Medicare Other

## 2016-03-02 NOTE — Progress Notes (Signed)
  Radiation Oncology         [phone] ________________________________  Name: Cheyenne Morris MRN: [id number]  Date: 02/16/2016  DOB: Nov 29, 1930  End of Treatment Note  Diagnosis:   82 y.o. woman with stage uT3N0 adenocarcinoma of the rectum - Stage IIA  Indication for treatment: Curative   Radiation treatment dates: 01/21/16 - 02/07/2016  Site/dose: The rectum was treated with 41.4 Gy in 23 fractions out of 45 Gy in 25 fractions with 1.8 Gy per fraction. She was treated in the prone position.  Beams/energy: 3D // 10X, 15X Photon  Narrative: On 02/13/2016, the patient presented for an under treatment assessment. She experienced a loss of appetite, weight loss, rectal irritation, diarrhea, and fatigue. Her blood pressure was 79/48 and she had poor skin turgor. IV fluids were ordered. Her blood sugar was check and it was 399. She was subsequently admitted to the hospital.  The patient continued to have severe diarrhea during her admission for dehydration. She was noted to be neutropenic and hyperkalemic.  Unfortunately, the patient suffered sudden cardiac arrest, but was returned to normal circulation and placed in the ICU. The patient coded again at a later time and subsequently passed the evening of March 07, 2016. ________________________________  Sheral Apley. Tammi Klippel, M.D.  This document serves as a record of services personally performed by Tyler Pita, MD. It was created on his behalf by Darcus Austin, a trained medical scribe. The creation of this record is based on the scribe's personal observations and the provider's statements to them. This document has been checked and approved by the attending provider.

## 2016-03-03 NOTE — Consult Note (Signed)
[place]  Department of Emergency Medicine   Code Blue CONSULT NOTE  Chief Complaint: Cardiac arrest/unresponsive   Level V Caveat: Unresponsive  History of present illness: I was contacted by the hospital for a CODE BLUE cardiac arrest upstairs and presented to the patient's bedside. Patient seen earlier in the day after a sudden in-hospital arrest.  He was found at that time that she was hyperkalemic with a K of 6.9 extremely acidotic.  She has been managed in the intensive care unit on a bicarbonate drip and with had been given Kayexalate and resuscitated.  Per nursing staff she is becoming increasingly unstable and was maxing out on IV pressors.  She had a sudden episode of bradycardia and arrested in the intensive care unit while on the ventilator.  At time of my arrival CPR was in progress.   ROS: Unable to obtain, Level V caveat  Scheduled Meds: . [START ON 02/23/2016] antiseptic oral rinse  7 mL Mouth Rinse QID  . calcitonin  50 Units Subcutaneous Daily  . chlorhexidine gluconate (SAGE KIT)  15 mL Mouth Rinse BID  . heparin  5,000 Units Subcutaneous Q8H  . hydrocortisone   Rectal TID  . insulin aspart  0-9 Units Subcutaneous TID WC  . piperacillin-tazobactam (ZOSYN)  IV  3.375 g Intravenous Q8H  . sodium chloride flush  3 mL Intravenous Q12H  . [START ON 02/23/2016] vancomycin  500 mg Intravenous Q24H   Continuous Infusions: . epinephrine 3.5 mcg/min (03/23/16 1710)  . norepinephrine (LEVOPHED) Adult infusion 40 mcg/min (03-23-16 1727)  .  sodium bicarbonate  infusion 1000 mL 150 mL/hr at 23-Mar-2016 1710  . vasopressin (PITRESSIN) infusion - *FOR SHOCK* 0.03 Units/min (2016/03/23 1150)   PRN Meds:.acetaminophen **OR** acetaminophen, magic mouthwash w/lidocaine, ondansetron **OR** ondansetron (ZOFRAN) IV, sodium chloride flush, witch hazel-glycerin Past Medical History  Diagnosis Date  . Diabetes mellitus   . Hypertension   . Rectal cancer [facility])    Past  Surgical History  Procedure Laterality Date  . Colonoscopy    . Cataract extraction, bilateral    . Colonoscopy  01/05/2012    Dr Jenkins:Sessile polyp in the ascending colon/Normal colonoscopy otherwise, path tubular adenoma  . Colonoscopy N/A 12/20/2015    Procedure: COLONOSCOPY;  Surgeon: Danie Binder, MD;  [facility];  Service: Endoscopy;  Laterality: N/A;  0930  . Esophagogastroduodenoscopy N/A 12/20/2015    Procedure: ESOPHAGOGASTRODUODENOSCOPY (EGD);  Surgeon: Danie Binder, MD;  [facility];  Service: Endoscopy;  Laterality: N/A;  . Eus N/A 01/02/2016    Procedure: LOWER ENDOSCOPIC ULTRASOUND (EUS);  Surgeon: [name], MD;  Location: [facility];  Service: Endoscopy;  Laterality: N/A;   Social History   Social History  . Marital Status: Married    Spouse Name: N/A  . Number of Children: N/A  . Years of Education: N/A   Occupational History  . Not on file.   Social History Main Topics  . Smoking status: Never Smoker   . Smokeless tobacco: Never Used     Comment: Never smoked  . Alcohol Use: No  . Drug Use: No  . Sexual Activity: No   Other Topics Concern  . Not on file   Social History Narrative   Allergies  Allergen Reactions  . Lime [Lime, Sulfurated] Swelling    Lips swell    Last set of Vital Signs (not current) Filed Vitals:   Mar 23, 2016 1645 23-Mar-2016 1708  BP: 106/69 62/45  Pulse: 27  29  Temp: 95.7 F (35.4 C) 96.1 F (35.6 C)  Resp: 26 28      Physical Exam Gen: unresponsive Cardiovascular: pulseless  Resp: apneic. Breath sounds equal bilaterally with bagging on ET tube. Coarse with rhonchi Abd: nondistended  Neuro: GCS 3, unresponsive to pain  HEENT: No blood in posterior pharynx, gag reflex absent  Neck: No crepitus  Musculoskeletal: No deformity  Skin: warm  Procedures   ++++++++++++++++++++++++++++++++++++++++++++++++++++++++++++++++++   Cardiopulmonary Resuscitation (CPR) Procedure  Note Directed/Performed by: Hoy Morn I personally directed ancillary staff and/or performed CPR in an effort to regain return of spontaneous circulation and to maintain cardiac, neuro and systemic perfusion.    +++++++++++++++++++++++++++++++++++++++++++++++++++++++++++++++++++++++++  Medical Decision making  Severe hyperkalemia and ongoing shock despite maximal medical therapy  Assessment and Plan  ACLS performed.  Additional epi, bicarbonate, calcium given.  Transient return of spontaneous circulation followed by V. fib which was defibrillated.  Despite maximal care and therapy she remained without a pulse and became asystolic.  Given her advanced age and poor prognosis I had a prolonged discussion with family as well as with the eICU attending Dr. Emmit Alexanders who recommended discontinuation of ACLS. Family at bedside grieving

## 2016-03-03 NOTE — Progress Notes (Addendum)
Dr. Elsworth Soho updated on patient's current status. On his way in to see patient. Patient currently nonresponsive on the ventilator with levophed and vasopressin infusing, MAP in the 40's. Core Temp 92.8. Warming blanket applied, temp foley placed. Family is in waiting room with Chaplain at this time.

## 2016-03-03 NOTE — Progress Notes (Signed)
Unable to obtain abg

## 2016-03-03 NOTE — Consult Note (Signed)
[facility] Department of Emergency Medicine - [facility]   Code Blue CONSULT NOTE  Chief Complaint: Cardiac arrest/unresponsive   Level V Caveat: Unresponsive  History of present illness: I was contacted by the hospital for a CODE BLUE cardiac arrest upstairs and presented to the patient's bedside.  Reportedly the patient has a history of rectal cancer and is receiving IV chemotherapy.  She reportedly was more obtunded and altered this morning by nursing staff and then suddenly became unresponsive with nursing staff in the room.  Nursing began CPR immediately.  Anesthesia present at the same time as I did an performed airway management and intubation.  Pulseless on my arrival with asystole  ROS: Unable to obtain, Level V caveat  Scheduled Meds: Continuous Infusions: PRN Meds:. Past Medical History  Diagnosis Date  . Diabetes mellitus   . Hypertension   . Rectal cancer [facility])    Past Surgical History  Procedure Laterality Date  . Colonoscopy    . Cataract extraction, bilateral    . Colonoscopy  01/05/2012    Dr Jenkins:Sessile polyp in the ascending colon/Normal colonoscopy otherwise, path tubular adenoma  . Colonoscopy N/A 12/20/2015    Procedure: COLONOSCOPY;  Surgeon: Danie Binder, MD;  [facility];  Service: Endoscopy;  Laterality: N/A;  0930  . Esophagogastroduodenoscopy N/A 12/20/2015    Procedure: ESOPHAGOGASTRODUODENOSCOPY (EGD);  Surgeon: Danie Binder, MD;  [facility];  Service: Endoscopy;  Laterality: N/A;  . Eus N/A 01/02/2016    Procedure: LOWER ENDOSCOPIC ULTRASOUND (EUS);  Surgeon: [name], MD;  Location: [facility];  Service: Endoscopy;  Laterality: N/A;   Social History   Social History  . Marital Status: Married    Spouse Name: N/A  . Number of Children: N/A  . Years of Education: N/A   Occupational History  . Not on file.   Social History Main Topics  . Smoking status: Never Smoker   .  Smokeless tobacco: Never Used     Comment: Never smoked  . Alcohol Use: No  . Drug Use: No  . Sexual Activity: No   Other Topics Concern  . Not on file   Social History Narrative   Allergies  Allergen Reactions  . Lime [Lime, Sulfurated] Swelling    Lips swell    Last set of Vital Signs (not current) Filed Vitals:   08/07/11 1922 08/07/11 1951  BP: 170/68 142/65  Pulse:    Temp:    Resp:        Physical Exam  Gen: unresponsive Cardiovascular: pulseless  Resp: apneic. Breath sounds equal bilaterally with bagging  Abd: nondistended  Neuro: GCS 3, unresponsive to pain  HEENT: dentures present  Neck: No crepitus  Musculoskeletal: No deformity  Skin: warm  ++++++++++++++++++++++++++++++++++++++++++++++++++++++++++++++++++++++++  Procedures    CRITICAL CARE Performed by: Hoy Morn Total critical care time: 30 Critical care time was exclusive of separately billable procedures and treating other patients. Critical care was necessary to treat or prevent imminent or life-threatening deterioration. Critical care was time spent personally by me on the following activities: development of treatment plan with patient and/or surrogate as well as nursing, discussions with consultants, evaluation of patient's response to treatment, examination of patient, obtaining history from patient or surrogate, ordering and performing treatments and interventions, ordering and review of laboratory studies, ordering and review of radiographic studies, pulse oximetry and re-evaluation of patient's condition.  Cardiopulmonary Resuscitation (CPR) Procedure Note Directed/Performed by: Hoy Morn I personally directed ancillary  staff and/or performed CPR in an effort to regain return of spontaneous circulation and to maintain cardiac, neuro and systemic perfusion.    Angiocath insertion Performed by: Hoy Morn Consent: Verbal consent obtained. Risks and benefits: risks, benefits  and alternatives were discussed Time out: Immediately prior to procedure a "time out" was called to verify the correct patient, procedure, equipment, support staff and site/side marked as required. Preparation: Patient was prepped and draped in the usual sterile fashion. Vein Location: left external jugular vein Gauge: 18 Normal blood return and flush without difficulty Patient tolerance: Patient tolerated the procedure well with no immediate complications.   ++++++++++++++++++++++++++++++++++++++++++++++++++++++++++++++++++++++++++++++++  Medical Decision making  Unknown etiology for collapse and pulselessness. ACLS on my arrival Assessment and Plan  After 4 rounds of CPR, epinephrine 5, bicarbonate 2, and airway by anesthesia the patient regained return of spontaneous circulation.  Initial blood pressure was 55/27.  She is being resuscitated with IV fluids at this time and will start phenylephrine.  Family is at bedside.  Patient is a full code.  Plan will be for transfer to ICU and ongoing supportive care.  Of note after return of spontaneous circulation she appeared to have a wide-complex tachycardia which seemed to narrow with both bicarbonate and additional calcium given.

## 2016-03-03 NOTE — Progress Notes (Signed)
Chaplain responded to a CODE BLUE at 1240. I stood with the family throughout the CPR. The CPR revived Ms Urbin enough for her to be transferred to the ICU for more acute care. Chaplain prayed with the family and was asked to ask all that would to continue to pray for Ms [facility].  Follow up spiritual care is advised to assist the family during the critical period.  Sallee Lange. Jeanetta Alonzo, [place]

## 2016-03-03 NOTE — Progress Notes (Signed)
Pharmacy Antibiotic Note  Cheyenne Morris is a 82 y.o. female with rectal cancer on 5-FU pump and getting radiation, admitted on 02/09/2016 with weakness and diarrhea. On 7/22, Code Blue called for cardiac arrest after becoming more lethargic. Noted with copious vomitus in mouth and posterior pharynx. Intubated by anesthesia. Achieved return of spontaneous circulation, transferred to ICU. Pharmacy has been consulted for vancomycin and Zosyn dosing for febrile neutropenia with hemodynamic instability + aspiration.  Plan:  Vancomycin 1000 mg IV now, then 500 mg IV q24 hr; goal trough 15-20 mcg/mL  Measure vancomycin trough levels at steady state as indicated  Zosyn 3.375 g IV given once over 30 minutes, then every 8 hrs by 4-hr infusion  F/u SCr closely, expect to rise significantly after cardiac arrest  Follow clinical course, renal function, culture results as available  Follow for de-escalation of antibiotics and LOT  Height: 5\' 2"  (157.5 cm) Weight: 127 lb (57.607 kg) IBW/kg (Calculated) : 50.1  Temp (24hrs), Avg:95.9 F (35.5 C), Min:92.8 F (33.8 C), Max:98.7 F (37.1 C)   Recent Labs Lab 02/17/16 1333 02/17/16 1333 02/10/2016 1450 03-01-16 0400  WBC 1.2*  --  0.2* 0.2*  CREATININE  --  1.3* 1.40* 1.50*    Estimated Creatinine Clearance: 22.9 mL/min (by C-G formula based on Cr of 1.5).    Allergies  Allergen Reactions  . Lime [Lime, Sulfurated] Swelling    Lips swell    Antimicrobials this admission: Vancomycin 7/22 >>  Zosyn 7/22 >>   Dose adjustments this admission: ---  Microbiology results: 7/22 MRSA PCR: neg  Thank you for allowing pharmacy to be a part of this patient's care.  Reuel Boom, PharmD, BCPS Pager: [phone] 03-01-2016, 3:25 PM

## 2016-03-03 NOTE — Progress Notes (Signed)
Aline attempted x 2 RTs aline was unsuccessful.

## 2016-03-03 NOTE — Discharge Summary (Signed)
Death Summary  Cheyenne Morris [id number] DOB: 12-May-1931 DOA: 03/12/16  PCP: Robert Bellow, MD  Admit date: 12-Mar-2016 Date of Death: 03-13-16 Time of Death: Nov 22, 1743 hrs Notification: Robert Bellow, MD notified of death of 03/15/16   History of present illness:  Cheyenne Morris is a 82 y.o. female with medical history significant of stage IIA rectal cancer, she is undergoing continuous 5-FU infusion along with daily radiation. She was presented initially to Dr. Johny Shears office for her routine radiation, but reported that she is been so weak, dizzy upon standing. She also reported diarrhea for several days. Initial blood pressure was 88/54 which dropped down to 79/48 upon standing. Patient was given IV fluids, was found to have blood sugar of 399 so direct admit was requested.  Final Diagnoses:   Patient admitted to the hospital with dehydration resulting from diarrhea, patient is known to have mucositis from the continuous 5-FU infusion and had subacute diarrhea for some time. Came into the hospital dehydrated and to the point she was orthostatic when she was presented to radiation oncology for her treatment. After admitted to the hospital aggressive hydration with IV fluids were started, patient was hypokalemic which was replaced, on the days she had the cardiopulmonary arrest potassium was 3.3. Seen the patient twice in the morning, she was eating her breakfast her chair with her husband at bedside when I saw her in the morning. I saw her also midmorning and she was feeling weak but no other complaints. I later received a call from the RN that she is lethargic, while I was on phone with the RN to get her vitals, she developed cardiopulmonary arrest and CODE BLUE was called. Chest compression was started right away as the RN was literally at bedside when this happened. Successful return of normal circulation, given 2 more liters of fluids during the code and was transferred to the  ICU for further evaluation.   Patient transferred to the ICU personnel as she is intubated, I spoke personally with Dr. Lamonte Sakai of PCCM. To my knowledge, she coded later and ACLS protocol was started again, unfortunately she did not survive at this time. She pronounced dead by the [place] team at 1745 hrs.  There was concern about potassium at some point if it was over replaced. Potassium was 3. 3 in the morning. Patient was receiving IV fluids with 20 mEq of potassium when it at the rate of 100 mL/Hr, patient received 60 mEq of oral potassium as well. During the code the phlebotomy personnel came in and drove blood test after patient stabilized likely around 1 PM, the BMP was not reported so I personally called the lab and they told me "blood is just sitting here and need orders" I placed the orders at 3:04 PM and it was reported at 3:33 PM. Potassium reported to be at 6.9 and calcium of 13.4, both increased from 3.3 and 8.2 respectively. Not sure if these results are exaggerated may be because of the BMP was not done right away,  Principal Problem:   Dehydration Active Problems:   Rectal cancer (HCC)   Diarrhea   Mucositis   Hypokalemia   Hemorrhoids   Chemotherapy induced neutropenia (HCC)   Drug-induced neutropenia (HCC)   Generalized weakness     The results of significant diagnostics from this hospitalization (including imaging, microbiology, ancillary and laboratory) are listed below for reference.    Significant Diagnostic Studies: Dg Abd 1 View  Result Date: 03-13-2016 CLINICAL DATA:  Nausea and vomiting today.  Intubated. EXAM: ABDOMEN - 1 VIEW COMPARISON:  None. FINDINGS: Dilated gas-filled loops of small bowel are seen throughout the abdomen and pelvis suggesting small bowel obstruction. Paucity of colonic bowel gas. Stomach also moderately distended with air. No evidence of soft tissue mass or abnormal fluid collection. No evidence of free intraperitoneal air. Dextroscoliosis  of the lumbar spine. Osseous structures are otherwise unremarkable. IMPRESSION: Distended gas-filled loops of small bowel throughout the abdomen and pelvis, consistent with small bowel obstruction. These results will be called to the ordering clinician or representative by the Radiologist Assistant, and communication documented in the PACS or zVision Dashboard. Electronically Signed   By: Franki Cabot M.D.   On: 03-20-2016 14:04   Dg Chest Port 1 View  Result Date: 2016/03/20 CLINICAL DATA:  Status post endotracheal tube placement. EXAM: PORTABLE CHEST 1 VIEW COMPARISON:  Chest x-ray dated 10/23/2015. FINDINGS: Endotracheal tube well positioned with tip just above the level of the carina. Right chest wall Port-A-Cath in place with tip adequately positioned at the level of the mid SVC. Cardiomediastinal silhouette appears otherwise stable in size and configuration. Central pulmonary vascular congestion. Mild interstitial edema within the right upper lobe and left lower lobe. No pleural effusion or pneumothorax seen. Osseous structures about the chest are unremarkable. IMPRESSION: 1. Endotracheal tube well positioned with tip just above the level of the carina. 2. Central pulmonary vascular congestion and mild bilateral interstitial edema, likely related to resuscitation efforts. Electronically Signed   By: Franki Cabot M.D.   On: 20-Mar-2016 14:05    Microbiology: Recent Results (from the past 240 hour(s))  MRSA PCR Screening     Status: None   Collection Time: 03/20/16  1:58 PM  Result Value Ref Range Status   MRSA by PCR NEGATIVE NEGATIVE Final    Comment:        The GeneXpert MRSA Assay (FDA approved for NASAL specimens only), is one component of a comprehensive MRSA colonization surveillance program. It is not intended to diagnose MRSA infection nor to guide or monitor treatment for MRSA infections.      Labs: Basic Metabolic Panel:  Recent Labs Lab 02/17/16 1333  02/12/2016 1450  March 20, 2016 0400 2016/03/20 1400  NA 136  --  134* 135 148*  K 3.2*  < > 2.6* 3.3* 6.9*  CL  --   --  106 109 116*  CO2 24  --  21* 18* 13*  GLUCOSE 137  --  316* 222* 138*  BUN 32.4*  --  46* 44* 48*  CREATININE 1.3*  --  1.40* 1.50* 2.35*  CALCIUM 9.3  --  8.6* 8.2* 13.4*  MG  --   --   --  2.1  --   < > = values in this interval not displayed. Liver Function Tests:  Recent Labs Lab 02/17/16 1333  AST 16  ALT 11  ALKPHOS 41  BILITOT 0.82  PROT 6.7  ALBUMIN 3.5   No results for input(s): LIPASE, AMYLASE in the last 168 hours. No results for input(s): AMMONIA in the last 168 hours. CBC:  Recent Labs Lab 02/17/16 1333 02/19/2016 1450 20-Mar-2016 0400 2016-03-20 1400  WBC 1.2* 0.2* 0.2* 0.1*  NEUTROABS 1.1* 0.2* 0.2*  --   HGB 9.8* 9.6* 10.4* 7.7*  HCT 29.5* 28.9* 30.1* 23.5*  MCV 83.9 82.3 80.5 85.1  PLT 184 162 158 115*   Cardiac Enzymes:  Recent Labs Lab 03-20-2016 1400  TROPONINI 0.06*   D-Dimer No results  for input(s): DDIMER in the last 72 hours. BNP: Invalid input(s): POCBNP CBG:  Recent Labs Lab 02/10/2016 1337 02/10/2016 1551 02/23/2016 2145 03-21-2016 1637  GLUCAP 378* 290* 223* 168*   Anemia work up No results for input(s): VITAMINB12, FOLATE, FERRITIN, TIBC, IRON, RETICCTPCT in the last 72 hours. Urinalysis    Component Value Date/Time   COLORURINE YELLOW 02/05/2016 1540   APPEARANCEUR CLEAR 02/20/2016 1540   LABSPEC 1.021 02/12/2016 1540   PHURINE 6.0 02/20/2016 1540   GLUCOSEU >1000 (A) 02/25/2016 1540   HGBUR SMALL (A) 02/27/2016 1540   BILIRUBINUR NEGATIVE 02/10/2016 1540   KETONESUR NEGATIVE 02/27/2016 1540   PROTEINUR NEGATIVE 03/02/2016 1540   UROBILINOGEN 0.2 08/01/2011 1802   NITRITE NEGATIVE 02/29/2016 1540   LEUKOCYTESUR NEGATIVE 02/19/2016 1540   Sepsis Labs Invalid input(s): PROCALCITONIN,  WBC,  LACTICIDVEN     SIGNED:  Birdie Hopes, MD  [facility] 02/24/2016, 11:32 AM Pager   If 7PM-7AM, please contact  night-coverage [email or web address] Password [email or web address]

## 2016-03-03 NOTE — Consult Note (Signed)
PULMONARY / CRITICAL CARE MEDICINE   Name: Cheyenne Morris MRN: [id number] DOB: Feb 04, 1931    ADMISSION DATE:  02/11/2016 CONSULTATION DATE:  03-07-16  REFERRING MD:  Hartford Poli, [facility]  CHIEF COMPLAINT:  Cardiac arrest  HISTORY OF PRESENT ILLNESS:   82 year old with stage IIIa rectal cancer, undergoing chemotherapy with concurrent radiation was admitted 7/21 with generalized weakness and dizziness attributed to mucositis and dehydration, admission CBG was 399. She was found to be neutropenic with ANC 0.2 and Neupogen was given, she was rehydrated with IV fluids and hyperglycemia and hypo-kalemia were being corrected. She suffered sudden cardiac arrest on 7/21 afternoon, initial rhythm was asystole, after 4 rounds of CPR, epinephrine 5, bicarbonate 2 she regained spontaneous circulation, she was also shocked 2. Airway was obtained by anesthesia but there was emesis [facility] M asked to resume care at this point after transfer to the ICU  PAST MEDICAL HISTORY :  She  has a past medical history of Diabetes mellitus; Hypertension; and Rectal cancer ([place]).  PAST SURGICAL HISTORY: She  has past surgical history that includes Colonoscopy; Cataract extraction, bilateral; Colonoscopy (01/05/2012); Colonoscopy (N/A, 12/20/2015); Esophagogastroduodenoscopy (N/A, 12/20/2015); and EUS (N/A, 01/02/2016).  Allergies  Allergen Reactions  . Lime [Lime, Sulfurated] Swelling    Lips swell    No current facility-administered medications on file prior to encounter.   Current Outpatient Prescriptions on File Prior to Encounter  Medication Sig  . Calcium Carbonate-Vitamin D (CALCIUM-VITAMIN D) 600-200 MG-UNIT CAPS Take 1 tablet by mouth every morning.   . diphenoxylate-atropine (LOMOTIL) 2.5-0.025 MG tablet Take two (2) caps 4 to 6 times daily, alternate with Imodium prn diarrhea (Patient taking differently: Take 2 tablets by mouth 3 (three) times daily. Take two (2) caps 4 to 6 times daily, alternate with Imodium  prn diarrhea)  . fluconazole (DIFLUCAN) 100 MG tablet Take 1 tablet (100 mg total) by mouth daily. (Patient taking differently: Take 100 mg by mouth daily. Started 07/18 to 14 days)  . iron polysaccharides (NIFEREX) 150 MG capsule Take 1 capsule (150 mg total) by mouth daily. (Patient taking differently: Take 150 mg by mouth daily with breakfast. )  . latanoprost (XALATAN) 0.005 % ophthalmic solution Place 1 drop into both eyes at bedtime.   Marland Kitchen lisinopril (PRINIVIL,ZESTRIL) 20 MG tablet Take 20 mg by mouth daily.    . magic mouthwash w/lidocaine SOLN Take 5 mLs by mouth 4 (four) times daily as needed for mouth pain ([employer] formula w/ nystatin and lidocaine 1:1:1.).  Marland Kitchen ondansetron (ZOFRAN) 8 MG tablet Take 1 tablet (8 mg total) by mouth every 8 (eight) hours as needed for nausea or vomiting.  . potassium chloride SA (K-DUR,KLOR-CON) 20 MEQ tablet Take 10 mEq by mouth daily.   . prochlorperazine (COMPAZINE) 10 MG tablet Take 1 tablet (10 mg total) by mouth every 6 (six) hours as needed for nausea or vomiting.  . simvastatin (ZOCOR) 40 MG tablet Take 40 mg by mouth daily.   Marland Kitchen triamterene-hydrochlorothiazide (MAXZIDE) 75-50 MG tablet Take 0.5 tablets by mouth daily.   Marland Kitchen lidocaine-prilocaine (EMLA) cream Apply a quarter size amount to port site 1 hour prior to chemo. Do not rub in. Cover with plastic wrap. (Patient not taking: Reported on 02/20/2016)  . omeprazole (PRILOSEC) 10 MG capsule Take 10 mg by mouth daily.    FAMILY HISTORY:  Her has no family status information on file.   SOCIAL HISTORY: She  reports that she has never smoked. She has never used smokeless tobacco. She reports that  she does not drink alcohol or use illicit drugs.  REVIEW OF SYSTEMS:   Unable to obtain  SUBJECTIVE:   VITAL SIGNS: BP 64/43 mmHg  Pulse 51  Temp(Src) 95 F (35 C) (Core (Comment))  Resp 26  Ht 5\' 2"  (1.575 m)  Wt 127 lb (57.607 kg)  BMI 23.22 kg/m2  SpO2 82%  HEMODYNAMICS:    VENTILATOR  SETTINGS: Vent Mode:  [-] PRVC FiO2 (%):  [100 %] 100 % Set Rate:  [22 bmp] 22 bmp PEEP:  [5 cmH20] 5 cmH20 Plateau Pressure:  [19 cmH20] 19 cmH20  INTAKE / OUTPUT: I/O last 3 completed shifts: In: 1600 [P.O.:400; I.V.:1200] Out: 600 [Urine:600]  PHYSICAL EXAMINATION: General:  Acutely ill, intubated and unresponsive Neuro:  Pupils mid dilated, not reactive to light HEENT:  No JVD, left EJ access, right chest Port-A-Cath Cardiovascular:  S1 and S2 tachycardia Lungs:  Clear breath sounds bilateral with faint expiratory rhonchi Abdomen:  Soft, nontender, mild distention, hyperactive bowel sounds Musculoskeletal:  No deformity Skin:  Intact, no petechiae  LABS:  BMET  Recent Labs Lab 02/17/16 1333 02/15/2016 1450 17-Mar-2016 0400  NA 136 134* 135  K 3.2* 2.6* 3.3*  CL  --  106 109  CO2 24 21* 18*  BUN 32.4* 46* 44*  CREATININE 1.3* 1.40* 1.50*  GLUCOSE 137 316* 222*    Electrolytes  Recent Labs Lab 02/17/16 1333 02/26/2016 1450 2016-03-17 0400  CALCIUM 9.3 8.6* 8.2*  MG  --   --  2.1    CBC  Recent Labs Lab 02/17/16 1333 02/08/2016 1450 2016-03-17 0400  WBC 1.2* 0.2* 0.2*  HGB 9.8* 9.6* 10.4*  HCT 29.5* 28.9* 30.1*  PLT 184 162 158    Coag's  Recent Labs Lab 02/05/2016 1450  INR 1.63*    Sepsis Markers No results for input(s): LATICACIDVEN, PROCALCITON, O2SATVEN in the last 168 hours.  ABG  Recent Labs Lab 2016-03-17 1402  PHART 6.853*  PCO2ART 37.8  PO2ART 273*    Liver Enzymes  Recent Labs Lab 02/17/16 1333  AST 16  ALT 11  ALKPHOS 41  BILITOT 0.82  ALBUMIN 3.5    Cardiac Enzymes No results for input(s): TROPONINI, PROBNP in the last 168 hours.  Glucose  Recent Labs Lab 02/25/2016 1337 02/21/2016 1551 02/21/16 2145  GLUCAP 378* 290* 223*    Imaging Dg Abd 1 View  17-Mar-2016  CLINICAL DATA:  Nausea and vomiting today.  Intubated. EXAM: ABDOMEN - 1 VIEW COMPARISON:  None. FINDINGS: Dilated gas-filled loops of small bowel are seen  throughout the abdomen and pelvis suggesting small bowel obstruction. Paucity of colonic bowel gas. Stomach also moderately distended with air. No evidence of soft tissue mass or abnormal fluid collection. No evidence of free intraperitoneal air. Dextroscoliosis of the lumbar spine. Osseous structures are otherwise unremarkable. IMPRESSION: Distended gas-filled loops of small bowel throughout the abdomen and pelvis, consistent with small bowel obstruction. These results will be called to the ordering clinician or representative by the Radiologist Assistant, and communication documented in the PACS or zVision Dashboard. Electronically Signed   By: Franki Cabot M.D.   On: 03/17/16 14:04   Dg Chest Port 1 View  03/17/2016  CLINICAL DATA:  Status post endotracheal tube placement. EXAM: PORTABLE CHEST 1 VIEW COMPARISON:  Chest x-ray dated 10/23/2015. FINDINGS: Endotracheal tube well positioned with tip just above the level of the carina. Right chest wall Port-A-Cath in place with tip adequately positioned at the level of the mid SVC. Cardiomediastinal silhouette appears  otherwise stable in size and configuration. Central pulmonary vascular congestion. Mild interstitial edema within the right upper lobe and left lower lobe. No pleural effusion or pneumothorax seen. Osseous structures about the chest are unremarkable. IMPRESSION: 1. Endotracheal tube well positioned with tip just above the level of the carina. 2. Central pulmonary vascular congestion and mild bilateral interstitial edema, likely related to resuscitation efforts. Electronically Signed   By: Franki Cabot M.D.   On: 03-04-16 14:05     STUDIES:    CULTURES: Blood 7/22 >>  ANTIBIOTICS: 7/22 ceftaz >> vanc 7/22 >>  SIGNIFICANT EVENTS: 7/22 cardiac arrest  LINES/TUBES: ETT 7/22  DISCUSSION: Sudden unexplained cardiac arrest-now severely acidotic and KUB suggesting small bowel obstruction Initial admitted was for severe dehydration  attributed to mucositis from chemotherapy for rectal cancer Differential diagnosis includes coronary ischemia, less likely pulmonary embolism  ASSESSMENT / PLAN:  PULMONARY A: Acute respiratory failure Aspiration pneumonia P:   Vent settings adjusted-respiratory rate increased for severe acidosis  CARDIOVASCULAR A:  Cardiac arrest-etiologies include acute coronary syndrome , less likely pulmonary embolism Post arrest-cardiogenic shock versus septic P:  Levophed drip Add vasopressin drip Improve acidosis If hypotension persists-then proceed with epinephrine drip Try to obtain arterial line Serial troponins  RENAL A:   AK I Severe metabolic acidosis P:   Bicarbonate 100 mg IV and start drip add 100 mL per hour Repeat ABG  GASTROINTESTINAL A:   Small bowel obstruction History of rectal cancer P:   O G-tube inserted by me Connect to LIS  HEMATOLOGIC A:   Leukopenia due to chemotherapy P:  Neupogen given Follow CBC daily Doubt VTE, but obtain venous duplex to clarify   INFECTIOUS A:   Immunocompromised patient P:   Empiric ceftazidime/vancomycin Pro-calcitonin algorithm to minimize duration  ENDOCRINE A:   Uncontrolled diabetes   P:   SSI  NEUROLOGIC A:   At risk anoxic encephalopathy P:   RASS goal: 0 Unable to prognosticate at this time   FAMILY  - Updates: Discussed with husband, 2 daughters and son-poor prognosis given given extreme acidosis and refractory shock, explained to them that she would not survive another arrest-there would still want [place] to perform resuscitation if required  - Inter-disciplinary family meet or Palliative Care meeting due by:  day 7   cctime x 18m  Kara Mead MD. [facility]. [facility] Pager [phone] If no response call [phone]     04-Mar-2016, 2:35 PM

## 2016-03-03 NOTE — Progress Notes (Signed)
Patient who had been in sinus tachycardia since arrival to unit began to brady down suddenly and lost pulse at approximately 1730. Code blue was called and CPR initiated. ED physician and numerous ACLS personnel were at bedside. MD declared the time of death at 19 after a discussion with the family during the code. CDS notified of death, patient's belongings given to daughters, patient cleaned and the room was set up for family. Chaplain is at the bedside with the family now.

## 2016-03-03 NOTE — Progress Notes (Signed)
  I received a page from RN at around 12:30 that patient looks dehydrated and more lethargic. Earlier RN stated that patient reported more lose BMs and asked for [facility], it was ordered but was not placed. I asked the RN to get vital signs, while she is on the phone with me and getting vital signs patient coded. CODE BLUE was called at around 12:35. Resuscitation was ran by the code team. There is return of normal circulation, patient vomited large amount of vomitus. BMP, CBC, magnesium, lactic acid and troponin ordered. Patient was getting IV and oral potassium, follow on potassium levels. Patient intubated by anesthesiology, she'll be going down to the floor. I spoke with Dr. Lamonte Sakai (PCCM) already about the patient.  Birdie Hopes Pager: [phone] Mar 04, 2016, 1:25 PM

## 2016-03-03 NOTE — Progress Notes (Signed)
Chaplain paged to say that Ms Cheyenne Morris had coded a second time and that she did not survive her second session of CPR. I stood with the family as they mourned the death of their loved one, and assisted the family pastor in escorting family and friends to see Ms Cheyenne Morris. Although the family was relieved that Ms Cheyenne Morris survived her CPR encounter, they saw the time after it as a time to say their good-byes. The family requested that the tube be removed so that they could say their proper respects without seeing Ms Cheyenne Morris in her entubated state. The family was deeply appreciative of the gentle care and compassion given them by the staff. Mr Cheyenne Morris asked the chaplain to tell everyone thank you.  Sallee Lange. Christalynn Boise, DMin, MDiv Chaplain

## 2016-03-03 NOTE — Progress Notes (Signed)
PROGRESS NOTE  Cheyenne Morris  [id number] DOB: 1931/07/05 DOA: 02/04/2016 PCP: Robert Bellow, MD Outpatient Specialists:  Subjective: Seen with her husband at bedside, feels very weak. Denies any fever or chills. Denies any diarrhea since yesterday mild abdominal pain  Brief Narrative:  Cheyenne Morris is a 82 y.o. female with medical history significant of stage IIA rectal cancer, she is undergoing continuous 5-FU infusion along with daily radiation. She was presented initially to Dr. Johny Shears office for her routine radiation, but reported that she is been so weak, dizzy upon standing. She also reported diarrhea for several days. Initial blood pressure was 88/54 which dropped down to 79/48 upon standing. Patient was given IV fluids, was found to have blood sugar of 399 so direct admit was requested.  Assessment & Plan:   Principal Problem:   Dehydration Active Problems:   Rectal cancer (HCC)   Diarrhea   Mucositis   Hypokalemia   Hemorrhoids   Chemotherapy induced neutropenia (HCC)   Drug-induced neutropenia (HCC)   Generalized weakness   Dehydration -Directly admitted from Dr. Johny Shears office because of dehydration, diarrhea and orthostatic hypotension. -Given 1 L of fluids, I'll continue IV fluid and 100 mL/hour. -Patient was orthostatic on admission, no BMP to know if she has acute renal failure. -Check BMP daily, monitor weight daily to avoid fluid overload.  Diarrhea and probable mucositis -Patient is on continuous 5-FU infusion, now has daily diarrhea which probably what caused the dehydration. -No bowel movements since yesterday, has abdominal pain, will hold any antidiarrheal.  Hypokalemia -Check BMP, given oral potassium, continued on IV fluids with potassium  Hemorrhoids -Exacerbated by the recent diarrhea, hydrocortisone.  Diabetes mellitus type 2, controlled -With elevated blood sugar this morning of 399 in the office, patient received IV  D5W. -Will control with SSI, carbohydrate modified diet, A1c is 6.9.  Neutrophilia secondary to chemotherapy -Total WBC is 0.2 and ANC is 0.2, Neupogen given yesterday and today.  CKD stage III -Baseline creatinine 1.4, round baseline right now.  Stage IIA rectal cancer -Continue 5-FU and daily radiation.   DVT prophylaxis:  Code Status: Full Code Family Communication:  Disposition Plan:  Diet: Diet Carb Modified Fluid consistency:: Thin; Room service appropriate?: Yes  Consultants:   Onc  Procedures:   None  Antimicrobials:   None   Objective: Filed Vitals:   03/01/2016 1332 02/19/2016 2055 Mar 20, 2016 0353  BP: 139/78 135/71 92/62  Pulse: 101 97 79  Temp: 97.8 F (36.6 C) 98.1 F (36.7 C) 98.7 F (37.1 C)  TempSrc: Oral Oral Oral  Resp: 16 17 16   Height: 5\' 2"  (1.575 m)    Weight: 57.607 kg (127 lb)    SpO2: 100% 100% 96%    Intake/Output Summary (Last 24 hours) at 2016/03/20 0929 Last data filed at 03/20/2016 [id number]  Gross per 24 hour  Intake   1700 ml  Output    600 ml  Net   1100 ml   Filed Weights   02/23/2016 1332  Weight: 57.607 kg (127 lb)    Examination: General exam: Appears calm and comfortable  Respiratory system: Clear to auscultation. Respiratory effort normal. Cardiovascular system: S1 & S2 heard, RRR. No JVD, murmurs, rubs, gallops or clicks. No pedal edema. Gastrointestinal system: Abdomen is nondistended, soft and nontender. No organomegaly or masses felt. Normal bowel sounds heard. Central nervous system: Alert and oriented. No focal neurological deficits. Extremities: Symmetric 5 x 5 power. Skin: No rashes, lesions or ulcers Psychiatry: Judgement and insight  appear normal. Mood & affect appropriate.   Data Reviewed: I have personally reviewed following labs and imaging studies  CBC:  Recent Labs Lab 02/17/16 1333 02/12/2016 1450 03/17/16 0400  WBC 1.2* 0.2* 0.2*  NEUTROABS 1.1* 0.2* 0.2*  HGB 9.8* 9.6* 10.4*  HCT 29.5* 28.9* 30.1*   MCV 83.9 82.3 80.5  PLT 184 162 0000000   Basic Metabolic Panel:  Recent Labs Lab 02/17/16 1333 02/20/2016 1450 2016-03-17 0400  NA 136 134* 135  K 3.2* 2.6* 3.3*  CL  --  106 109  CO2 24 21* 18*  GLUCOSE 137 316* 222*  BUN 32.4* 46* 44*  CREATININE 1.3* 1.40* 1.50*  CALCIUM 9.3 8.6* 8.2*  MG  --   --  2.1   GFR: Estimated Creatinine Clearance: 22.9 mL/min (by C-G formula based on Cr of 1.5). Liver Function Tests:  Recent Labs Lab 02/17/16 1333  AST 16  ALT 11  ALKPHOS 41  BILITOT 0.82  PROT 6.7  ALBUMIN 3.5   No results for input(s): LIPASE, AMYLASE in the last 168 hours. No results for input(s): AMMONIA in the last 168 hours. Coagulation Profile:  Recent Labs Lab 02/11/2016 1450  INR 1.63*   Cardiac Enzymes: No results for input(s): CKTOTAL, CKMB, CKMBINDEX, TROPONINI in the last 168 hours. BNP (last 3 results) No results for input(s): PROBNP in the last 8760 hours. HbA1C:  Recent Labs  02/03/2016 1450  HGBA1C 6.9*   CBG:  Recent Labs Lab 02/04/2016 1337 02/10/2016 1551 03/02/2016 2145  GLUCAP 378* 290* 223*   Lipid Profile: No results for input(s): CHOL, HDL, LDLCALC, TRIG, CHOLHDL, LDLDIRECT in the last 72 hours. Thyroid Function Tests:  Recent Labs  02/03/2016 1450  TSH 0.451   Anemia Panel: No results for input(s): VITAMINB12, FOLATE, FERRITIN, TIBC, IRON, RETICCTPCT in the last 72 hours. Urine analysis:    Component Value Date/Time   COLORURINE YELLOW 02/20/2016 1540   APPEARANCEUR CLEAR 02/01/2016 1540   LABSPEC 1.021 03/01/2016 1540   PHURINE 6.0 02/20/2016 1540   GLUCOSEU >1000* 02/13/2016 1540   HGBUR SMALL* 02/10/2016 1540   BILIRUBINUR NEGATIVE 02/07/2016 1540   KETONESUR NEGATIVE 02/28/2016 1540   PROTEINUR NEGATIVE 02/24/2016 1540   UROBILINOGEN 0.2 08/01/2011 1802   NITRITE NEGATIVE 02/07/2016 1540   LEUKOCYTESUR NEGATIVE 02/15/2016 1540   Sepsis Labs: @LABRCNTIP (procalcitonin:4,lacticidven:4)  )No results found for this or  any previous visit (from the past 240 hour(s)).   Invalid input(s): PROCALCITONIN, [place]   Radiology Studies: No results found.      Scheduled Meds: . filgrastim  300 mcg Subcutaneous Once  . heparin  5,000 Units Subcutaneous Q8H  . insulin aspart  0-9 Units Subcutaneous TID WC  . sodium chloride flush  3 mL Intravenous Q12H   Continuous Infusions: . 0.9 % NaCl with KCl 20 mEq / L 100 mL/hr at 2016/03/17 0402     LOS: 1 day    Time spent: 35 minutes    Michai Dieppa A, MD [facility] Pager [phone]  If 7PM-7AM, please contact night-coverage [email or web address] Password [facility] 03-17-16, 9:29 AM

## 2016-03-03 NOTE — Significant Event (Signed)
I checked the chart around 3 PM, the labs were drawn during the code were not reported. The lab personnel instructed me to place the orders in as they have the blood but it was not processed because there was no orders on Epic.  Birdie Hopes Pager: [phone] 03/20/16, 3:05 PM

## 2016-03-03 NOTE — Progress Notes (Signed)
eLink Physician-Brief Progress Note Patient Name: Cheyenne Morris DOB: June 09, 1931 MRN: [id number]   Date of Service  2016-03-12  HPI/Events of Note  Multiple issues: 1. K+ = 6.9, Ca++ = 13.4, Lactic Acid = 15.0 and Troponin = 0.06. Troponin and lactic acid elevations likely d/t recent code. Elevated K+ d/t acidosis and renal dysfunction. Patient is currently on a NaHCO3 IV infusion.   eICU Interventions  Will order: 1. Kayexalate 30 gm via tube now. 2. Calcitonin 50 units [place] now. 3. BMP at 11 PM. 4. Lactic Acid level at 7 PM and Q 3 hours X 3.  Await results of ABG.     Intervention Category Major Interventions: Electrolyte abnormality - evaluation and management  Roxsana Riding [place] 03/12/2016, 3:58 PM

## 2016-03-03 NOTE — Progress Notes (Signed)
RN in room to place flexiseal due to patient's liquid continuous diarrhea.  When RN entered room, son at bedside, RN observed pt who looked less responsive than earlier in AM and breathing pattern was slower. RN spoke to son and explained that patient was having very loose watery diarrhea and that she appeared dehydrated due to her dry mouth and dry skin. In addition RN communicated that we were going to place a flexiseal to divert the diarrhea into a collection bag and keep it off her skin as her bottom was becoming irritated.  The flexiseal would also allow [place] to accurately measure pt's output and ensure we are providing adequate amount of fluid to replace what she looses.  RN also states that she will page MD to re-evaluate pt and placed the page at this time.  Tech present at bedside as well and attached dinamap to obtain a set of vital signs.  Son exited room at this time and spouse walked in. MD called RN in response to the page and RN relayed that the patient was less alert and appeared more dehydrated and requested that the MD round again to re-evaluate pt. While the MD was on the phone, tech was obtaining VS and RN began to relate VS to MD, however before dinamap finished cycling pt became un-reponsive and lost her pulse.  RN called a code blue and began immediate CPR.   Patient's bed placed in CPR position and chest compressions initiated.  After start of chest compressions pt expelled what appeared to be digested GI contents through her mouth.  Code team arrived and resuscitation efforts continued. After resuscitation efforts completed pt was transferred to ICU.

## 2016-03-03 NOTE — Progress Notes (Signed)
CRITICAL VALUE ALERT  Critical value received: K  6.9,  Calcium  13.4,  Trop  0.06 Date of notification: 03-06-16  Time of notification: 3:35 pm  Critical value read back: yes Nurse who received alert:  Lonie Peak RN  MD notified (1st page): E Link  phy Time of first page:3:37 pm  MD notified (2nd page):  Time of second page:  Responding MD:   Time MD responded:

## 2016-03-03 NOTE — Progress Notes (Signed)
Anesthesiology Note:  Called to assist with Code blue in 82 year old female with rectal cancer who suffered a cardiac arrest. Upon arrival to patient's room, she was pulseless and unresponsive with copious vomitus in mouth and posterior pharynx. Patient intubated per CRNA Riki Sheer. ETT secured at 21 cm at lip with positive color change from CO2 detector and bilateral breath sounds. Resusitation  directed by Dr. Venora Maples with return of cardiac rhythm and pulse.   Roberts Gaudy

## 2016-03-03 NOTE — Progress Notes (Signed)
RN requesting quad strength levophed; confirmed updated Alaris pump settings.  Reuel Boom, PharmD, BCPS Pager: [phone] 2016/03/21, 3:32 PM

## 2016-03-03 DEATH — deceased

## 2016-03-04 ENCOUNTER — Ambulatory Visit (HOSPITAL_COMMUNITY): Payer: Medicare Other | Admitting: Hematology & Oncology

## 2016-03-09 ENCOUNTER — Other Ambulatory Visit: Payer: Medicare Other

## 2016-03-16 ENCOUNTER — Other Ambulatory Visit: Payer: Medicare Other

## 2016-03-23 ENCOUNTER — Other Ambulatory Visit: Payer: Medicare Other

## 2016-03-30 ENCOUNTER — Other Ambulatory Visit: Payer: Medicare Other

## 2016-03-31 ENCOUNTER — Ambulatory Visit: Payer: Self-pay | Admitting: Radiation Oncology

## 2016-07-17 ENCOUNTER — Other Ambulatory Visit: Payer: Self-pay | Admitting: Nurse Practitioner

## 2017-03-10 NOTE — Progress Notes (Signed)
REVIEWED. PT DECEASED. 

## 2017-11-26 IMAGING — US IR FLUORO GUIDE CV LINE*R*
1 series · 1 of 1 positions shown · non-contrast
Comparison: none

CLINICAL DATA: Rectal carcinoma, needs long-term venous access for
chemotherapy
TECHNIQUE: The procedure, risks, benefits, and alternatives were explained to
the patient. Questions regarding the procedure were encouraged and
answered. The patient understands and consents to the procedure. As
antibiotic prophylaxis, cefazolin 2 g was ordered pre-procedure and
administered intravenously within one hour of incision. Patency of
the right IJ vein was confirmed with ultrasound with image
documentation. An appropriate skin site was determined. Skin site
was marked. Region was prepped using maximum barrier technique
including cap and mask, sterile gown, sterile gloves, large sterile
sheet, and Chlorhexidine as cutaneous antisepsis. The region was
infiltrated locally with 1% lidocaine. Under real-time ultrasound
guidance, the right IJ vein was accessed with a 21 gauge
micropuncture needle; the needle tip within the vein was confirmed
with ultrasound image documentation. Needle was exchanged over a 018
guidewire for transitional dilator which allowed passage of the
Benson wire into the IVC. Over this, the transitional dilator was
exchanged for a 5 French MPA catheter. A small incision was made on
the right anterior chest wall and a subcutaneous pocket fashioned.
The power-injectable port was positioned and its catheter tunneled
to the right IJ dermatotomy site. The MPA catheter was exchanged
over an Amplatz wire for a peel-away sheath, through which the port
catheter, which had been trimmed to the appropriate length, was
advanced and positioned under fluoroscopy with its tip at the
cavoatrial junction. Spot chest radiograph confirms good catheter
position and no pneumothorax. The pocket was closed with deep
interrupted and subcuticular continuous 3-0 Monocryl sutures. The
port was flushed per protocol. The incisions were covered with
Dermabond then covered with a sterile dressing.

COMPLICATIONS:
COMPLICATIONS
None immediate

[Series 1: ir fluoro/shunt/fist · 1 of 1 slices shown]
[im 1/1]
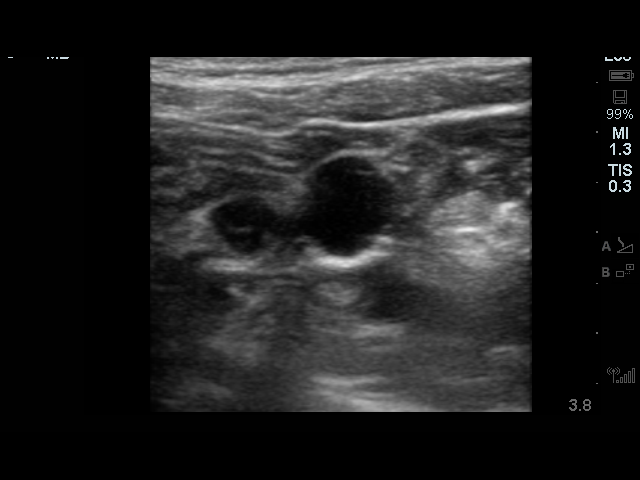

[1 of 1 positions shown; findings below may reference images not displayed]

EXAM:
TUNNELED PORT CATHETER PLACEMENT WITH ULTRASOUND AND FLUOROSCOPIC
GUIDANCE

FLUOROSCOPY TIME:  0.2 minute, 25 uHymA DAP

ANESTHESIA/SEDATION:
Intravenous Fentanyl and Versed were administered as conscious
sedation during continuous monitoring of the patient's level of
consciousness and physiological / cardiorespiratory status by the
radiology RN, with a total moderate sedation time of 16 minutes.
IMPRESSION: Technically successful right IJ power-injectable port catheter
placement. Ready for routine use.

## 2018-01-02 IMAGING — DX DG ABDOMEN 1V
1 series · 1 of 1 positions shown · non-contrast
Comparison: None.

CLINICAL DATA: Nausea and vomiting today.  Intubated.

EXAM:
ABDOMEN - 1 VIEW

[abdomen kub]
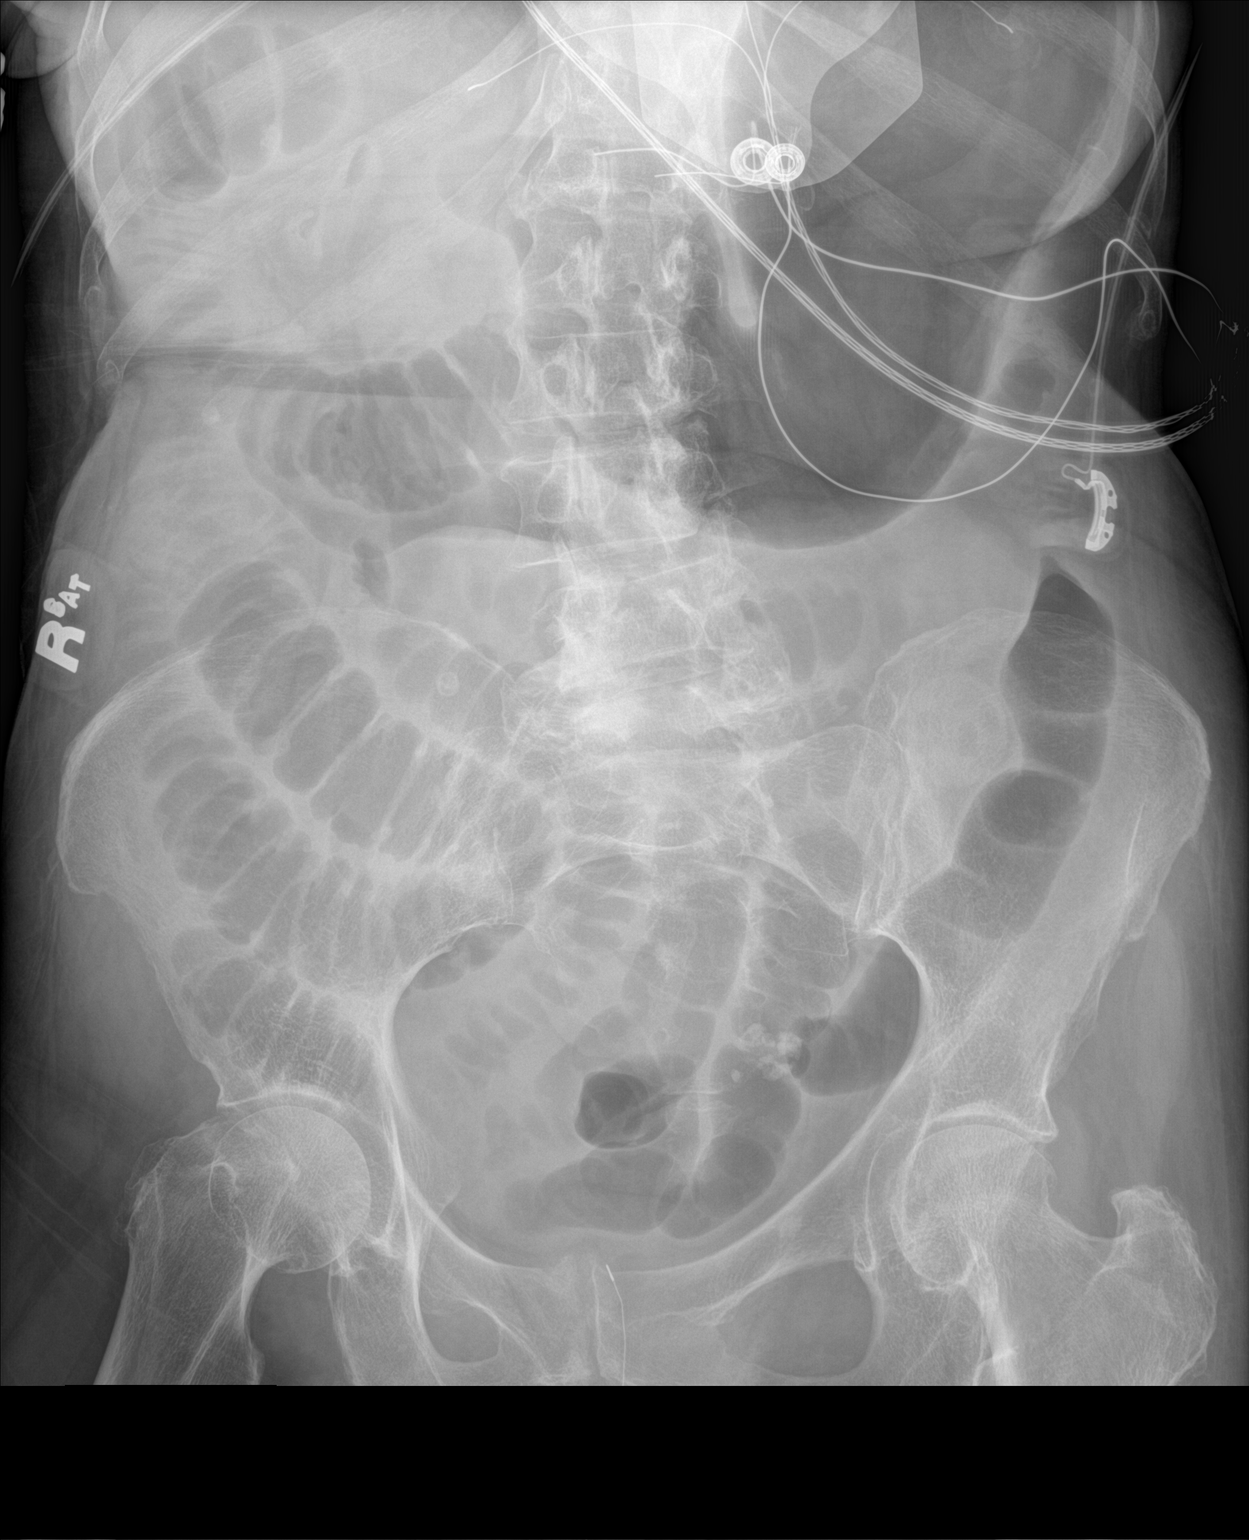

[1 of 1 positions shown; findings below may reference images not displayed]

FINDINGS: Dilated gas-filled loops of small bowel are seen throughout the
abdomen and pelvis suggesting small bowel obstruction. Paucity of
colonic bowel gas. Stomach also moderately distended with air.

No evidence of soft tissue mass or abnormal fluid collection. No
evidence of free intraperitoneal air. Dextroscoliosis of the lumbar
spine. Osseous structures are otherwise unremarkable.
IMPRESSION: Distended gas-filled loops of small bowel throughout the abdomen and
pelvis, consistent with small bowel obstruction.

These results will be called to the ordering clinician or
representative by the Radiologist Assistant, and communication
documented in the PACS or zVision Dashboard.
